# Patient Record
Sex: Female | Born: 1935 | Race: White | Hispanic: No | Smoking: Never smoker
Health system: Southern US, Community
[De-identification: ages and names within clinical notes are randomized; demographics above are authoritative.]

## PROBLEM LIST (undated history)

## (undated) ENCOUNTER — Ambulatory Visit: Admission: EM | Payer: Self-pay | Source: Home / Self Care

## (undated) DIAGNOSIS — K449 Diaphragmatic hernia without obstruction or gangrene: Secondary | ICD-10-CM

## (undated) DIAGNOSIS — K5732 Diverticulitis of large intestine without perforation or abscess without bleeding: Secondary | ICD-10-CM

## (undated) DIAGNOSIS — IMO0001 Reserved for inherently not codable concepts without codable children: Secondary | ICD-10-CM

## (undated) DIAGNOSIS — I10 Essential (primary) hypertension: Secondary | ICD-10-CM

## (undated) DIAGNOSIS — K5900 Constipation, unspecified: Secondary | ICD-10-CM

## (undated) DIAGNOSIS — K219 Gastro-esophageal reflux disease without esophagitis: Secondary | ICD-10-CM

## (undated) DIAGNOSIS — N189 Chronic kidney disease, unspecified: Secondary | ICD-10-CM

## (undated) DIAGNOSIS — K2270 Barrett's esophagus without dysplasia: Secondary | ICD-10-CM

## (undated) DIAGNOSIS — K2950 Unspecified chronic gastritis without bleeding: Secondary | ICD-10-CM

## (undated) HISTORY — PX: RECTOCELE REPAIR: SHX761

## (undated) HISTORY — PX: HERNIA REPAIR: SHX51

## (undated) HISTORY — PX: ESOPHAGOGASTRODUODENOSCOPY: SHX1529

## (undated) HISTORY — PX: ABDOMINAL HYSTERECTOMY: SHX81

## (undated) HISTORY — DX: Chronic kidney disease, unspecified: N18.9

## (undated) HISTORY — DX: Constipation, unspecified: K59.00

## (undated) HISTORY — DX: Essential (primary) hypertension: I10

## (undated) HISTORY — PX: OTHER SURGICAL HISTORY: SHX169

## (undated) HISTORY — DX: Diverticulitis of large intestine without perforation or abscess without bleeding: K57.32

## (undated) HISTORY — DX: Reserved for inherently not codable concepts without codable children: IMO0001

## (undated) HISTORY — PX: COLONOSCOPY: SHX174

## (undated) HISTORY — DX: Barrett's esophagus without dysplasia: K22.70

## (undated) HISTORY — DX: Diaphragmatic hernia without obstruction or gangrene: K44.9

## (undated) HISTORY — PX: BLADDER SUSPENSION: SHX72

## (undated) HISTORY — DX: Unspecified chronic gastritis without bleeding: K29.50

## (undated) HISTORY — DX: Gastro-esophageal reflux disease without esophagitis: K21.9

---

## 2000-01-16 ENCOUNTER — Ambulatory Visit (HOSPITAL_COMMUNITY): Admission: RE | Admit: 2000-01-16 | Discharge: 2000-01-16 | Payer: Self-pay | Admitting: *Deleted

## 2000-01-16 ENCOUNTER — Encounter (INDEPENDENT_AMBULATORY_CARE_PROVIDER_SITE_OTHER): Payer: Self-pay

## 2000-01-16 ENCOUNTER — Encounter: Payer: Self-pay | Admitting: *Deleted

## 2000-11-22 ENCOUNTER — Ambulatory Visit (HOSPITAL_COMMUNITY): Admission: RE | Admit: 2000-11-22 | Discharge: 2000-11-22 | Payer: Self-pay | Admitting: *Deleted

## 2005-07-04 ENCOUNTER — Ambulatory Visit (HOSPITAL_COMMUNITY): Admission: RE | Admit: 2005-07-04 | Discharge: 2005-07-04 | Payer: Self-pay | Admitting: *Deleted

## 2005-07-11 ENCOUNTER — Ambulatory Visit (HOSPITAL_COMMUNITY): Admission: RE | Admit: 2005-07-11 | Discharge: 2005-07-11 | Payer: Self-pay | Admitting: *Deleted

## 2006-06-28 ENCOUNTER — Observation Stay (HOSPITAL_COMMUNITY): Admission: RE | Admit: 2006-06-28 | Discharge: 2006-06-29 | Payer: Self-pay | Admitting: Urology

## 2006-11-12 ENCOUNTER — Ambulatory Visit (HOSPITAL_COMMUNITY): Admission: RE | Admit: 2006-11-12 | Discharge: 2006-11-12 | Payer: Self-pay | Admitting: *Deleted

## 2006-11-12 ENCOUNTER — Encounter (INDEPENDENT_AMBULATORY_CARE_PROVIDER_SITE_OTHER): Payer: Self-pay | Admitting: Specialist

## 2008-04-02 ENCOUNTER — Encounter: Admission: RE | Admit: 2008-04-02 | Discharge: 2008-04-02 | Payer: Self-pay | Admitting: Internal Medicine

## 2008-04-09 ENCOUNTER — Encounter: Admission: RE | Admit: 2008-04-09 | Discharge: 2008-04-09 | Payer: Self-pay | Admitting: Internal Medicine

## 2009-06-24 ENCOUNTER — Encounter: Admission: RE | Admit: 2009-06-24 | Discharge: 2009-06-24 | Payer: Self-pay | Admitting: Internal Medicine

## 2011-01-19 NOTE — Op Note (Signed)
NAMEBLESS, HANSELMAN                 ACCOUNT NO.:  [id number]   MEDICAL RECORD NO.:  [id number]          PATIENT TYPE:  INP   LOCATION:  0002                         FACILITY:  [facility]   PHYSICIAN:  Reece Packer, MD DATE OF BIRTH:  [birth year]   DATE OF PROCEDURE:  06/28/2006  DATE OF DISCHARGE:                                 OPERATIVE REPORT   PREOPERATIVE DIAGNOSES:  1. Vault prolapse.  2. Small cystocele.  3. Possible enterocele, rectocele.   POSTOPERATIVE DIAGNOSES:  1. Vaginal vault prolapse.  2. Small cystocele.  3. Vault prolapse repair plus rectocele repair utilizing dermal graft plus      perineal repair plus cystoscopy.   Ms. Tonn has symptomatic prolapse.  She has a high grade II cystocele that  I thought would deserve an anterior repair, but her primary problem was  vault prolapse with a rectocele and possible enterocele.   The patient was prepped and draped in the usual fashion.  She was given  preoperative antibiotics.  Her blood work was normal.  We took extra care in  leg positioning to minimize the risk of compartment syndrome, DVT, and  neuropathy.  She had a lot of posterior bulging.  The vaginal cuff dimples  are easily identified.  She had a fairly short anterior vaginal wall.  There  was no question with the posterior vaginal wall reduced, she had a minimal  grade 2 high cystocele and did not warrant cystocele repair based upon the  degree of bulging and the shortness of the anterior vaginal wall.   Between Allis clamps, I removed a triangle of perineal skin and then made a  long perineal midline incision all the way to approximately 1 cm from the  apex.  I had used approximately 30 mL of an epinephrine lidocaine mixture  for hemostasis to help develop a plane.  It was quite easy to dissect  sharply and bluntly her very thin vaginal wall mucosa from the very thin or  absent rectovaginal fascia.   I dissected back to the pelvic sidewall bilaterally  and even at the apex.  I  sharply and then bluntly dissected to the pararectal pillars in the upper  third of the vagina.  Hemostasis was excellent.   I then did a digital rectal examination, and I thought she had an  enterocele.  Dr. Ky Barban and I did a lot of sharp dissecting with appropriate  retractors, etc., and all she had was a lot of preperitoneal fat but no  enterocele.  She had a very large rectal fascial rectovaginal fascial defect  with a lot of thinning.  I pushed the yellow perirectal fat up towards the  apex and did, using 3-0 Vicryl, oversewed some thin rectovaginal fascia over  it reducing it.   At this point in time, I then did a two-layer imbricating midline closure,  reducing the posterior defect, and it made the posterior vaginal wall nice  and flat.  I took extra care not to strangulate any bowel.  I did additional  rectal examination, and I could not feel any suture  in the bowel.  I was  very happy with this part of the rectocele repair.   I then carefully placed with the Capio device, 1-0 Ethibond suture at the  ischial spine bilaterally.  There was no bleeding.  I then sewed a 10 x 6 cm  graft and trimmed it more in the shape of a hexagon, and it was sewn back to  the ischial spine bilaterally into the rectovaginal fascia just inside the  introitus.  It fit very well and gave excellent support.  I did not sew it  to the apex, as this would have dramatically shortened her vaginal length.   I trimmed approximately 12 mm of vaginal wall mucosa bilaterally.  I closed  the vaginal wall after irrigation with running 2-0 Vicryl on a CT-1 and used  the same needle to close the perineum from front to back.  She had excellent  vaginal length.   The patient was cystoscoped, and extra indigo carmine was given since she  did not concentrate blue dye that well.  There was no question that there  was efflux of indigo carmine from both ureteral orifices.  There was no   distortion of the bladder.  There was no distortion of the trigone or  ureteral orifices.   Foley catheter was inserted.  A vaginal pack was inserted.  Vaginal length  was excellent.  There is no vaginal narrowing.  I was very pleased with the  repair.  Total blood loss was less than 100 mL.  Leg position was good at  the end of the case.  The patient will be taken to recovery room and be  followed as per protocol.           ______________________________  Reece Packer, MD  Electronically Signed     SAM/MEDQ  D:  06/28/2006  T:  06/29/2006  Job:  [id number]

## 2011-01-19 NOTE — Op Note (Signed)
NAMEWYNNE, CRUVER NO.:  [id number]   MEDICAL RECORD NO.:  [id number]          PATIENT TYPE:  AMB   LOCATION:  ENDO                         FACILITY:  [facility]   PHYSICIAN:  Waverly Ferrari, M.D.    DATE OF BIRTH:  10/15/1929   DATE OF PROCEDURE:  07/11/2005  DATE OF DISCHARGE:                                 OPERATIVE REPORT   PROCEDURE:  Colonoscopy   ENDOSCOPIST:  Waverly Ferrari, M.D.   INDICATIONS:  Rectal bleeding.   ANESTHESIA:  Demerol 70, Versed 8 mg.   DESCRIPTION OF PROCEDURE:  With the patient mildly sedated in the left  lateral decubitus position and rolled to her back, the Olympus videoscopic  colonoscope was inserted in the rectum, passed under direct vision to the  cecum, identified by base of cecum and ileocecal valve.  The ileocecal valve  was somewhat redundant but otherwise unremarkable.  From this point, the  colonoscope was slowly withdrawn taking circumferential views of colonic  mucosa stopping in the rectum which appeared normal on direct and showed  hemorrhoids on retroflexed view.  The endoscope was straightened and  withdrawn. The patient's vital signs, pulse oximeter remained stable. The  patient tolerated the procedure well without complications.   FINDINGS:  1.  Internal hemorrhoids.  2.  Diverticulosis of the sigmoid colon and the ileocecal valve.  3.  Otherwise unremarkable examination.   PLAN:  Have the patient follow up with me as needed.           ______________________________  Waverly Ferrari, M.D.     GMO/MEDQ  D:  07/11/2005  T:  07/11/2005  Job:  [id number]

## 2011-01-19 NOTE — Op Note (Signed)
Brandy Fuentes, DOAKES NO.:  [id number]   MEDICAL RECORD NO.:  [id number]          PATIENT TYPE:  AMB   LOCATION:  ENDO                         FACILITY:  [facility]   PHYSICIAN:  Waverly Ferrari, M.D.    DATE OF BIRTH:  10/15/1929   DATE OF PROCEDURE:  07/04/2005  DATE OF DISCHARGE:                                 OPERATIVE REPORT   PROCEDURE:  Upper endoscopy with dilation.   INDICATIONS:  Dysphagia.   ANESTHESIA:  Demerol 70, Versed 7 mg.   PROCEDURE:  With the patient mildly sedated in the left lateral decubitus  position in room 2 of radiology at [place]  videoscopic endoscope was inserted in the mouth, passed under direct vision  through the esophagus, which appeared normal except for a possible  stricture, into the stomach. The fundus, body, antrum, duodenal bulb, and  second portion of duodenum were visualized. From this point, the endoscope  was slowly withdrawn taking circumferential views of the duodenal mucosa  until the endoscope had been pulled back into the stomach, placed in  retroflexion to view the stomach from below. The endoscope was straightened  and withdrawn after passing a guidewire. Subsequently 15, 17, and 18 Savary  dilators were passed rather easily under fluoroscopic guidance. With the  latter, the guidewire was removed, the  endoscope was reinserted into the  stomach and withdrawn taking circumferential views of the remaining gastric  and esophageal mucosa. The patient's vital signs and pulse oximeter remained  stable. The patient tolerated the procedure well without apparent  complications.   FINDINGS:  Distal esophageal stricture dilated to 15, 17, and 18 Savary.   PLAN:  Await clinical response. The patient will call me with results and  follow-up with me as an outpatient.           ______________________________  Waverly Ferrari, M.D.     GMO/MEDQ  D:  07/04/2005  T:  07/04/2005  Job:  [id number]

## 2011-01-19 NOTE — Procedures (Signed)
[facility]  Patient:    Brandy Fuentes, Brandy Fuentes                          MRN: [id number] Proc. Date: 11/22/00 Adm. Date:  [id number] Disc. Date: [id number] Attending:  Jim Desanctis                           Procedure Report  PROCEDURE:  Colonoscopy.  SURGEON:  Jim Desanctis, M.D.  INDICATIONS:  Rectal bleeding.  ANESTHESIA:  Demerol 70 and Versed 7 mg.  DESCRIPTION OF PROCEDURE:  With the patient mildly sedated in the left lateral decubitus position, the Olympus videoscopic colonoscope was inserted in the rectum and passed under direct vision to the cecum, identified by the ileocecal valve and appendiceal orifice, both of which were photographed. From this point, the colonoscope was slowly withdrawn, taking circumferential views of the entire colonic mucosa, stopping in the rectum which appeared normal on direct view and showed internal hemorrhoids on retroflexed view. The endoscope was straightened and withdrawn.  The patients vital signs and pulse oximeter remained stable.  The patient tolerated the procedure well and without apparent complications.  FINDINGS:  Hemorrhoids, otherwise unremarkable examination other than occasional diverticulum seen in the sigmoid.  PLAN:  Treat as needed for the hemorrhoids. DD:  11/22/00 TD:  11/24/00 Job: [id number]

## 2011-01-19 NOTE — Op Note (Signed)
NAMEIWALANI, SODA NO.:  [id number]   MEDICAL RECORD NO.:  [id number]          PATIENT TYPE:  AMB   LOCATION:  ENDO                         FACILITY:  [place]   PHYSICIAN:  Waverly Ferrari, M.D.    DATE OF BIRTH:  Feb 12, 1929   DATE OF PROCEDURE:  11/12/2006  DATE OF DISCHARGE:                               OPERATIVE REPORT   PROCEDURE:  Upper endoscopy.   INDICATIONS:  Hemoccult positivity.   ANESTHESIA:  Fentanyl 50 mcg, Versed 5 mg.   PROCEDURE:  With the patient mildly sedated in the left lateral  decubitus position, the Pentax videoscopic endoscope was inserted in the  mouth, passed under direct vision through the esophagus which appeared  normal until it reached the stomach which showed a hiatal hernia.  Fundus, body, antrum, duodenal bulb and second portion of the duodenum  were visualized.  From this point, the endoscope was slowly withdrawn  taking circumferential views of the duodenal mucosa until the endoscope  had been pulled into the stomach, placed in retroflexion to view the  stomach from below and in retroflexed view there were some velvety  changes of the cardia of the stomach which were photographed and  biopsied.  The endoscope was then straightened and withdrawn taking  circumferential views of the remaining gastric and esophageal mucosa.  The patient's vital signs and pulse oximetry remained stable.  The  patient tolerated the procedure well without apparent complications.   FINDINGS:  Velvety changes of the cardia of the stomach, biopsied.  Await biopsy report.  The patient will call me for results and follow up  of his outpatient procedure.           ______________________________  Waverly Ferrari, M.D.     GMO/MEDQ  D:  11/12/2006  T:  11/12/2006  Job:  [id number]

## 2011-01-19 NOTE — Op Note (Signed)
NAMENORENE, MINCER NO.:  [id number]   MEDICAL RECORD NO.:  [id number]          PATIENT TYPE:  AMB   LOCATION:  ENDO                         FACILITY:  [facility]   PHYSICIAN:  Waverly Ferrari, M.D.    DATE OF BIRTH:  03/11/1929   DATE OF PROCEDURE:  11/12/2006  DATE OF DISCHARGE:                               OPERATIVE REPORT   PROCEDURE:  Colonoscopy.   INDICATIONS:  Rectal bleeding.   ANESTHESIA:  Fentanyl 50 mcg, Versed 5 mg.   PROCEDURE:  With the patient mildly sedated in the left lateral  decubitus position, the Pentax videoscopic colonoscope was inserted into  the rectum and passed under direct vision to the cecum, identified by  ileocecal valve and appendiceal orifice, both of which were  photographed.  The ileocecal valve was somewhat reddened and bulbous and  this was photographed and biopsied.  From this point, the colonoscope  was then slowly withdrawn, taking circumferential views of the colonic  mucosa, stopping next in the descending colon where a small polyp was  seen, photographed, and removed using hot biopsy forceps technique,  setting at 20/200 blended current.  We next stopped in the sigmoid  colon, which was thickened and reddened, possibly diverticulitis  changes.  This was photographed and biopsied as well.  We next stopped  in the rectum, which appeared normal under direct view and on  retroflexed view showed a raised area just above the anus, which was  photographed and biopsied.  The endoscope was then straightened and  withdrawn.  The patient's vital signs and pulse oximeter remained  stable.  The patient tolerated the procedure well without apparent  complications.   FINDINGS:  Erythematous bulbous ileocecal valve.  Small polyp of  descending colon.  Thickened reddened sigmoid colon, biopsied.  Raised  area of rectum, photographed and biopsied.  Await biopsy reports.  The  patient will call me for results and follow up with me as an  outpatient.           ______________________________  Waverly Ferrari, M.D.     GMO/MEDQ  D:  11/12/2006  T:  11/12/2006  Job:  [id number]

## 2011-01-19 NOTE — Procedures (Signed)
[facility]  Patient:    Brandy Fuentes, Brandy Fuentes                          MRN: [id number] Adm. Date:  [id number] Attending:  Jim Desanctis                           Procedure Report  PROCEDURE:  Upper endoscopy with Savary dilation and biopsy.  ENDOSCOPIST:  Jim Desanctis, M.D.  INDICATION:  Dysphagia.  ANESTHESIA:  Demerol 60 mg and Versed 7 mg were given intravenously in divided doses.  DESCRIPTION OF PROCEDURE:  With patient mildly sedated in the left lateral decubitus position, the Olympus videoscopic endoscope was inserted in the mouth and passed under direct vision through the esophagus, which appeared normal, until we reached the distal esophagus where a stricture was seen above a hiatal hernia.  This was photographed.  Subsequently, we entered into the stomach.  The fundus, body and antrum appeared normal and were photographed. We entered into the duodenal bulb and second portion of duodenum, both of which appeared normal as well, and photographed from this point.  The endoscope was then slowly withdrawn, taking circumferential views of the entire duodenal mucosa until the endoscope had been pulled back in the stomach and placed in retroflexion to view the stomach from below, and this appeared normal as well from this view.  The endoscope was then straightened and pulled back from distal-to-proximal stomach, taking circumferential views of the entire gastric mucosa.  The endoscope was then reinserted to the antrum and the guidewire was passed.  Subsequently, dilators, 14, 17 and 18 Savary dilators, were passed without any resistance.  There was heme seen on the last one, with which the guidewire was withdrawn.  The endoscope was reinserted. Minimal bleeding was seen, none actively, in the esophagus.  The distal esophagus was approached and it was biopsied.  The endoscope was then withdrawn, taken circumferential views of the remaining esophageal mucosa which  otherwise appeared normal.  Patients vital signs and pulse oximetry remained stable.  Patient tolerated procedure well without apparent complications.  FINDINGS:  Esophageal stricture, dilated; await biopsy reports.  PLAN:  Clear liquids today; resume regular diet tomorrow.  Patient to follow up with me as an outpatient.DD:  01/16/00 TD:  01/17/00 Job: [id number]

## 2011-03-22 ENCOUNTER — Other Ambulatory Visit: Payer: Self-pay | Admitting: Internal Medicine

## 2011-03-22 DIAGNOSIS — R7989 Other specified abnormal findings of blood chemistry: Secondary | ICD-10-CM

## 2011-03-22 DIAGNOSIS — IMO0002 Reserved for concepts with insufficient information to code with codable children: Secondary | ICD-10-CM

## 2011-04-23 ENCOUNTER — Ambulatory Visit
Admission: RE | Admit: 2011-04-23 | Discharge: 2011-04-23 | Disposition: A | Discharge status: Home / Self Care | Payer: Medicare Other | Source: Ambulatory Visit | Attending: Internal Medicine | Admitting: Internal Medicine

## 2011-04-23 DIAGNOSIS — R7989 Other specified abnormal findings of blood chemistry: Secondary | ICD-10-CM

## 2011-04-23 DIAGNOSIS — IMO0002 Reserved for concepts with insufficient information to code with codable children: Secondary | ICD-10-CM

## 2012-05-26 ENCOUNTER — Encounter: Payer: Self-pay | Admitting: Internal Medicine

## 2012-06-25 ENCOUNTER — Ambulatory Visit (INDEPENDENT_AMBULATORY_CARE_PROVIDER_SITE_OTHER): Payer: Medicare Other | Admitting: Internal Medicine

## 2012-06-25 ENCOUNTER — Encounter: Payer: Self-pay | Admitting: Internal Medicine

## 2012-06-25 VITALS — BP 146/72 | HR 84 | Ht 60.0 in | Wt 168.4 lb

## 2012-06-25 DIAGNOSIS — K6289 Other specified diseases of anus and rectum: Secondary | ICD-10-CM

## 2012-06-25 DIAGNOSIS — K649 Unspecified hemorrhoids: Secondary | ICD-10-CM

## 2012-06-25 DIAGNOSIS — R159 Full incontinence of feces: Secondary | ICD-10-CM

## 2012-06-25 DIAGNOSIS — N816 Rectocele: Secondary | ICD-10-CM

## 2012-06-25 MED ORDER — PSYLLIUM 28 % PO PACK: 1.0000 | PACK | Freq: Two times a day (BID) | ORAL | Status: DC

## 2012-06-25 NOTE — Progress Notes (Signed)
Subjective:    Patient ID: Brandy Fuentes, female    DOB: 1929-03-31, 78 y.o.   MRN: [id number]  HPI This is a delightful elderly white woman, recently widowed this year, with a history of previous colonoscopy through Dr. Lajoyce Corners. She has had 2 colonoscopies, no polyps in 2002 but she had several polyps that were really hyperplastic or normal colonic mucosal tissue in 2006. She had a history of rectal bleeding intermittently with some hemorrhoid changes and she also had some mildly abnormal mucosa was some nonspecific inflammatory changes in the sigmoid and rectum. She is okay now though for the past year she has had intermittent fecal incontinence. She'll have normal bowel movements and then later in the day expel some looser stool. She also has chronic urinary incontinence and has had a rectocele and vaginal vault repair in 2007. She also has a history of a prolapsed bladder and thinks it that has recurred after a remote history of bladder suspension years ago. She is not interested in therapy for her urinary incontinence, she wears a pad and says that's fine. She denies any lower extremity weakness or sensory complaints. Otherwise, she is active and still takes care of her house, rake sleeves and drives a car. Her GI review of systems is otherwise negative No Known Allergies Current outpatient prescriptions:amLODipine (NORVASC) 5 MG tablet, Take 5 mg by mouth daily., Disp: , Rfl: ;  losartan (COZAAR) 100 MG tablet, Take 100 mg by mouth daily., Disp: , Rfl: ;  omeprazole (PRILOSEC) 20 MG capsule, Take 20 mg by mouth daily., Disp: , Rfl:   Past Medical History  Diagnosis Date  . Diverticulitis of sigmoid colon   . Chronic gastritis   . Barrett's esophagus   . Hypertension    Past Surgical History  Procedure Date  . Colonoscopy 11/22/00,07/11/05    Dr. Jim Desanctis  . Esophagogastroduodenoscopy 01/16/00,07/04/05    Dr. Jim Desanctis, with Dilation  . Abdominal hysterectomy   . Hernia repair   .  Rectocele repair   . Vault prolapse repair   . Bladder suspension    History   Social History  . Marital Status: Married    Spouse Name: N/A    Number of Children: 2  . Years of Education: N/A   Occupational History  . Retired    Social History Main Topics  . Smoking status: Never Smoker   . Smokeless tobacco: Never Used  . Alcohol Use: No  . Drug Use: No    Family History  Problem Relation Age of Onset  . Heart disease Mother   . Heart disease Father   . Colon cancer Brother   . Colon polyps Brother   . Heart disease Brother   . Heart disease Maternal Uncle   . Breast cancer Paternal Aunt   . Esophageal cancer Paternal Aunt   . Heart disease Paternal Uncle      Review of Systems This is positive for some arthritic back pain and joint pains, some fatigue, she has chronic urinary incontinence stress related, she complains of intermittent pedal edema. All other review of systems are negative at this time.    Objective:   Physical Exam General:  Well-developed, well-nourished and in no acute distress Eyes:  anicteric. ENT:   Mouth and posterior pharynx free of lesions.  Neck:   supple w/o thyromegaly or mass.  Lungs: Clear to auscultation bilaterally. Heart:  S1S2, no rubs, murmurs, gallops. Abdomen:  soft, non-tender, no hepatosplenomegaly, hernia, or mass and  BS+.  Rectal: Female staff present - + anal tags and hemorrhoids, no rash. Anal wink is absent. There is reduced resting sphincter tone. + rectocele mod-large, no mass, soft brown stool. Voluntary anal squeeze is reduced. Simulated defecation with appropriate abdominal contraction and descent. Lymph:  no cervical or supraclavicular adenopathy. Extremities:   no edema Skin   no rash. Neuro:  A&O x 3.  Psych:  appropriate mood and  Affect.   Data Reviewed: 2002 and 2006 colonoscopy reports and pathology reports. 2006 EGD report and pathology reports       Assessment & Plan:   1. Fecal incontinence     2. Anal sphincter incompetence   3. Rectocele   4. Unspecified hemorrhoids without mention of complication    1. I have recommended fiber supplementation to try to help his intermittent incontinence. She obviously has a weak anal sphincter, she did say she had tears and problems with she delivered her 2 children so it could be somewhat related to that and she probably has some other pelvic floor issues. 2. She is not interested in pursuing a colonoscopy and given that she has had 2 without precancerous polyps, and being 83 I do not recommend one at this time. I think we have an adequate explanation for her fecal incontinence and would not pursue further investigation. She understands and agrees with the plans to followup as needed.  I appreciate the opportunity to care for this patient.   CC: Helane Rima, MD

## 2012-06-25 NOTE — Patient Instructions (Addendum)
Per Dr. Carlean Purl try Metamucil which is over the counter.  Start with 1 teaspoon and work up to 1 tablespoon a day.  This may help with your bowel situation.  Return to see [place] as needed.  Thank you for choosing me and [facility].  Gatha Mayer, M.D., [facility]

## 2012-09-05 ENCOUNTER — Encounter: Payer: Self-pay | Admitting: Cardiovascular Disease

## 2012-09-05 DIAGNOSIS — K2270 Barrett's esophagus without dysplasia: Secondary | ICD-10-CM | POA: Insufficient documentation

## 2012-09-05 DIAGNOSIS — K5732 Diverticulitis of large intestine without perforation or abscess without bleeding: Secondary | ICD-10-CM | POA: Insufficient documentation

## 2012-09-05 DIAGNOSIS — I10 Essential (primary) hypertension: Secondary | ICD-10-CM | POA: Insufficient documentation

## 2012-09-08 ENCOUNTER — Encounter: Payer: Self-pay | Admitting: Cardiovascular Disease

## 2012-09-08 ENCOUNTER — Ambulatory Visit (INDEPENDENT_AMBULATORY_CARE_PROVIDER_SITE_OTHER): Payer: Medicare Other | Admitting: Cardiovascular Disease

## 2012-09-08 VITALS — BP 155/86 | HR 96 | Ht 60.0 in | Wt 174.8 lb

## 2012-09-08 DIAGNOSIS — R0609 Other forms of dyspnea: Secondary | ICD-10-CM

## 2012-09-08 DIAGNOSIS — R0989 Other specified symptoms and signs involving the circulatory and respiratory systems: Secondary | ICD-10-CM

## 2012-09-08 DIAGNOSIS — I10 Essential (primary) hypertension: Secondary | ICD-10-CM

## 2012-09-08 DIAGNOSIS — R0602 Shortness of breath: Secondary | ICD-10-CM

## 2012-09-08 DIAGNOSIS — R0600 Dyspnea, unspecified: Secondary | ICD-10-CM | POA: Insufficient documentation

## 2012-09-08 MED ORDER — FUROSEMIDE 20 MG PO TABS: 20.0000 mg | ORAL_TABLET | Freq: Every day | ORAL | Status: DC

## 2012-09-08 NOTE — Patient Instructions (Addendum)
**Note De-Identified Brandy Fuentes Obfuscation** Your physician has requested that you have an echocardiogram. Echocardiography is a painless test that uses sound waves to create images of your heart. It provides your doctor with information about the size and shape of your heart and how well your heart's chambers and valves are working. This procedure takes approximately one hour. There are no restrictions for this procedure.  Your physician has recommended you make the following change in your medication: stop taking Norvasc and start taking Lasix (Furosemide) 20 mg daily  Your physician recommends that you return for lab work in: 5 to 6 weeks   Your physician recommends that you schedule a follow-up appointment in: 5 to 6 weeks

## 2012-09-08 NOTE — Assessment & Plan Note (Signed)
D/C norvasc given edema.  Continue cozaar start lasix

## 2012-09-08 NOTE — Assessment & Plan Note (Signed)
Echo to assess RV and LV function Should be normal given normal CXR and ECG and exam.  Lasix and f/u labs in 5 weeks

## 2012-09-08 NOTE — Progress Notes (Signed)
Patient ID: Brandy Fuentes, female   DOB: Jul 07, 1929, 79 y.o.   MRN: [id number] 79 yo referred by urgent care for edema, HTN and dyspnea.  Nonsmoker with no history of heart or lung disease.  Has had swelling for over a year.  Recently had norvasc increased. No history of DVT.  Edema is worse as the day goes on and better in am.  No history of CHF.  CXR done at urgent care 12/23 reviewed and normal.  She is not on a diuretic.  Active for age but more extertional dyspnea.  No chest pain.  CXR showed no cardiomegaly.  Denies asthma or wheezing.  Compliant with meds.  Moderate salt intake.   Labs 12/23  WBC 7.5 Hct 37.7   ROS: Denies fever, malais, weight loss, blurry vision, decreased visual acuity, cough, sputum, SOB, hemoptysis, pleuritic pain, palpitaitons, heartburn, abdominal pain, melena, lower extremity edema, claudication, or rash.  All other systems reviewed and negative   General: Affect appropriate Healthy:  appears stated age 84: normal Neck supple with no adenopathy JVP normal no bruits no thyromegaly Lungs clear with no wheezing and good diaphragmatic motion Heart:  S1/S2 no murmur,rub, gallop or click PMI normal Abdomen: benighn, BS positve, no tenderness, no AAA no bruit.  No HSM or HJR Distal pulses intact with no bruits No edema Neuro non-focal Skin warm and dry No muscular weakness  Medications Current Outpatient Prescriptions  Medication Sig Dispense Refill  . losartan (COZAAR) 100 MG tablet Take 100 mg by mouth daily.      Marland Kitchen omeprazole (PRILOSEC) 20 MG capsule Take 20 mg by mouth daily.        Allergies Review of patient's allergies indicates no known allergies.  Family History: Family History  Problem Relation Age of Onset  . Heart disease Mother   . Heart disease Father   . Colon cancer Brother   . Colon polyps Brother   . Heart disease Brother   . Heart disease Maternal Uncle   . Breast cancer Paternal Aunt   . Esophageal cancer Paternal Aunt   .  Heart disease Paternal Uncle     Social History: History   Social History  . Marital Status: Married    Spouse Name: N/A    Number of Children: 2  . Years of Education: N/A   Occupational History  . Retired    Social History Main Topics  . Smoking status: Never Smoker   . Smokeless tobacco: Never Used  . Alcohol Use: No  . Drug Use: No  . Sexually Active: Not on file   Other Topics Concern  . Not on file   Social History Narrative  . No narrative on file    Electrocardiogram:  NSR rate 87 normal ECG  Assessment and Plan

## 2012-09-15 ENCOUNTER — Ambulatory Visit (HOSPITAL_COMMUNITY): Payer: Medicare Other | Attending: Cardiovascular Disease

## 2012-09-15 DIAGNOSIS — R0989 Other specified symptoms and signs involving the circulatory and respiratory systems: Secondary | ICD-10-CM | POA: Insufficient documentation

## 2012-09-15 DIAGNOSIS — I379 Nonrheumatic pulmonary valve disorder, unspecified: Secondary | ICD-10-CM | POA: Insufficient documentation

## 2012-09-15 DIAGNOSIS — R0602 Shortness of breath: Secondary | ICD-10-CM

## 2012-09-15 DIAGNOSIS — I369 Nonrheumatic tricuspid valve disorder, unspecified: Secondary | ICD-10-CM | POA: Insufficient documentation

## 2012-09-15 DIAGNOSIS — R0609 Other forms of dyspnea: Secondary | ICD-10-CM | POA: Insufficient documentation

## 2012-09-15 DIAGNOSIS — I10 Essential (primary) hypertension: Secondary | ICD-10-CM | POA: Insufficient documentation

## 2012-09-15 DIAGNOSIS — I059 Rheumatic mitral valve disease, unspecified: Secondary | ICD-10-CM | POA: Insufficient documentation

## 2012-09-15 NOTE — Progress Notes (Signed)
Echocardiogram performed.  

## 2012-10-03 ENCOUNTER — Other Ambulatory Visit: Payer: Self-pay | Admitting: *Deleted

## 2012-10-03 DIAGNOSIS — I10 Essential (primary) hypertension: Secondary | ICD-10-CM

## 2012-10-03 DIAGNOSIS — R609 Edema, unspecified: Secondary | ICD-10-CM

## 2012-10-06 ENCOUNTER — Other Ambulatory Visit (INDEPENDENT_AMBULATORY_CARE_PROVIDER_SITE_OTHER): Payer: Medicare Other

## 2012-10-06 ENCOUNTER — Ambulatory Visit (INDEPENDENT_AMBULATORY_CARE_PROVIDER_SITE_OTHER): Payer: Medicare Other | Admitting: Cardiovascular Disease

## 2012-10-06 ENCOUNTER — Encounter: Payer: Self-pay | Admitting: Cardiovascular Disease

## 2012-10-06 VITALS — BP 132/72 | HR 70 | Ht 60.0 in | Wt 166.6 lb

## 2012-10-06 DIAGNOSIS — I10 Essential (primary) hypertension: Secondary | ICD-10-CM

## 2012-10-06 DIAGNOSIS — R609 Edema, unspecified: Secondary | ICD-10-CM

## 2012-10-06 LAB — BASIC METABOLIC PANEL WITH GFR
BUN: 43 mg/dL — ABNORMAL HIGH (ref 6–23)
CO2: 23 meq/L (ref 19–32)
Calcium: 8.8 mg/dL (ref 8.4–10.5)
Chloride: 105 meq/L (ref 96–112)
Creatinine, Ser: 2.7 mg/dL — ABNORMAL HIGH (ref 0.4–1.2)
GFR: 18.32 mL/min — ABNORMAL LOW (ref >60.00)
Glucose, Bld: 94 mg/dL (ref 70–99)
Potassium: 4 meq/L (ref 3.5–5.1)
Sodium: 138 meq/L (ref 135–145)

## 2012-10-06 NOTE — Progress Notes (Signed)
Patient ID: Brandy Fuentes, female   DOB: 12-13-1928, 79 y.o.   MRN: [id number] 79 yo referred by urgent care for edema, HTN and dyspnea. Nonsmoker with no history of heart or lung disease. Has had swelling for over a year. Recently had norvasc increased. No history of DVT. Edema is worse as the day goes on and better in am. No history of CHF. CXR done at urgent care 12/23 reviewed and normal. She is not on a diuretic. Active for age but more extertional dyspnea. No chest pain. CXR showed no cardiomegaly. Denies asthma or wheezing. Compliant with meds. Moderate salt intake.  Labs 12/23 WBC 7.5 Hct 37.7   Started on lasix 3 weeks ago and calcium blocker stopped due to edema  Echo 09/15/12 normal EF 55-60% and mild MR  Improved with med changes. BMET today  ROS: Denies fever, malais, weight loss, blurry vision, decreased visual acuity, cough, sputum, SOB, hemoptysis, pleuritic pain, palpitaitons, heartburn, abdominal pain, melena, lower extremity edema, claudication, or rash.  All other systems reviewed and negative  General: Affect appropriate Healthy:  appears stated age 81: normal Neck supple with no adenopathy JVP normal no bruits no thyromegaly Lungs clear with no wheezing and good diaphragmatic motion Heart:  S1/S2 no murmur, no rub, gallop or click PMI normal Abdomen: benighn, BS positve, no tenderness, no AAA no bruit.  No HSM or HJR Distal pulses intact with no bruits Trace edema with varicose veins  Neuro non-focal Skin warm and dry No muscular weakness   Current Outpatient Prescriptions  Medication Sig Dispense Refill  . furosemide (LASIX) 20 MG tablet Take 1 tablet (20 mg total) by mouth daily.  30 tablet  2  . losartan (COZAAR) 100 MG tablet Take 100 mg by mouth daily.      Marland Kitchen omeprazole (PRILOSEC) 20 MG capsule Take 20 mg by mouth daily.        Allergies  Review of patient's allergies indicates no known allergies.  Electrocardiogram:  09/08/12 SR rate 87  normal    Assessment and Plan

## 2012-10-06 NOTE — Assessment & Plan Note (Signed)
Well controlled.  Continue current medications and low sodium Dash type diet.    

## 2012-10-06 NOTE — Patient Instructions (Signed)
Your physician recommends that you schedule a follow-up appointment in: AS NEEDED  Your physician recommends that you continue on your current medications as directed. Please refer to the Current Medication list given to you today.  

## 2012-10-06 NOTE — Assessment & Plan Note (Signed)
Resolved with med changes more from varicose veins  Check lytes  F/U Dr Lavone Neri

## 2012-10-09 ENCOUNTER — Other Ambulatory Visit: Payer: Self-pay | Admitting: *Deleted

## 2012-10-09 DIAGNOSIS — Z79899 Other long term (current) drug therapy: Secondary | ICD-10-CM

## 2012-10-13 ENCOUNTER — Other Ambulatory Visit: Payer: Medicare Other

## 2012-10-13 ENCOUNTER — Ambulatory Visit: Payer: Medicare Other | Admitting: Cardiovascular Disease

## 2012-10-21 ENCOUNTER — Encounter: Payer: Self-pay | Admitting: Cardiovascular Disease

## 2012-10-30 ENCOUNTER — Other Ambulatory Visit (INDEPENDENT_AMBULATORY_CARE_PROVIDER_SITE_OTHER): Payer: Medicare Other

## 2012-10-30 DIAGNOSIS — Z79899 Other long term (current) drug therapy: Secondary | ICD-10-CM

## 2012-10-30 LAB — BASIC METABOLIC PANEL WITH GFR
BUN: 32 mg/dL — ABNORMAL HIGH (ref 6–23)
CO2: 24 meq/L (ref 19–32)
Calcium: 9.4 mg/dL (ref 8.4–10.5)
Chloride: 104 meq/L (ref 96–112)
Creatinine, Ser: 2.3 mg/dL — ABNORMAL HIGH (ref 0.4–1.2)
GFR: 22.13 mL/min — ABNORMAL LOW (ref >60.00)
Glucose, Bld: 95 mg/dL (ref 70–99)
Potassium: 3.8 meq/L (ref 3.5–5.1)
Sodium: 138 meq/L (ref 135–145)

## 2012-11-03 ENCOUNTER — Other Ambulatory Visit: Payer: Self-pay | Admitting: *Deleted

## 2012-11-03 DIAGNOSIS — N289 Disorder of kidney and ureter, unspecified: Secondary | ICD-10-CM

## 2012-11-03 DIAGNOSIS — O358XX0 Maternal care for other (suspected) fetal abnormality and damage, not applicable or unspecified: Secondary | ICD-10-CM

## 2012-11-03 DIAGNOSIS — O35EXX0 Maternal care for other (suspected) fetal abnormality and damage, fetal genitourinary anomalies, not applicable or unspecified: Secondary | ICD-10-CM

## 2012-11-03 MED ORDER — FUROSEMIDE 20 MG PO TABS: 10.0000 mg | ORAL_TABLET | Freq: Every day | ORAL | Status: DC

## 2012-11-24 ENCOUNTER — Other Ambulatory Visit: Payer: Medicare Other

## 2012-11-26 ENCOUNTER — Other Ambulatory Visit (INDEPENDENT_AMBULATORY_CARE_PROVIDER_SITE_OTHER): Payer: Medicare Other

## 2012-11-26 ENCOUNTER — Encounter (INDEPENDENT_AMBULATORY_CARE_PROVIDER_SITE_OTHER): Payer: Medicare Other

## 2012-11-26 DIAGNOSIS — I10 Essential (primary) hypertension: Secondary | ICD-10-CM

## 2012-11-26 DIAGNOSIS — R609 Edema, unspecified: Secondary | ICD-10-CM

## 2012-11-26 DIAGNOSIS — O358XX0 Maternal care for other (suspected) fetal abnormality and damage, not applicable or unspecified: Secondary | ICD-10-CM

## 2012-11-26 DIAGNOSIS — N189 Chronic kidney disease, unspecified: Secondary | ICD-10-CM

## 2012-11-26 DIAGNOSIS — N289 Disorder of kidney and ureter, unspecified: Secondary | ICD-10-CM

## 2012-11-26 DIAGNOSIS — O35EXX0 Maternal care for other (suspected) fetal abnormality and damage, fetal genitourinary anomalies, not applicable or unspecified: Secondary | ICD-10-CM

## 2012-11-26 LAB — BASIC METABOLIC PANEL WITH GFR
BUN: 29 mg/dL — ABNORMAL HIGH (ref 6–23)
CO2: 26 meq/L (ref 19–32)
Calcium: 9.2 mg/dL (ref 8.4–10.5)
Chloride: 108 meq/L (ref 96–112)
Creatinine, Ser: 2.3 mg/dL — ABNORMAL HIGH (ref 0.4–1.2)
GFR: 21.57 mL/min — ABNORMAL LOW (ref >60.00)
Glucose, Bld: 94 mg/dL (ref 70–99)
Potassium: 3.9 meq/L (ref 3.5–5.1)
Sodium: 141 meq/L (ref 135–145)

## 2012-11-30 DIAGNOSIS — R52 Pain, unspecified: Secondary | ICD-10-CM | POA: Insufficient documentation

## 2012-12-10 ENCOUNTER — Other Ambulatory Visit: Payer: Self-pay | Admitting: *Deleted

## 2012-12-10 DIAGNOSIS — N289 Disorder of kidney and ureter, unspecified: Secondary | ICD-10-CM

## 2012-12-12 DIAGNOSIS — N185 Chronic kidney disease, stage 5: Secondary | ICD-10-CM | POA: Insufficient documentation

## 2012-12-12 DIAGNOSIS — D649 Anemia, unspecified: Secondary | ICD-10-CM | POA: Insufficient documentation

## 2012-12-12 DIAGNOSIS — R600 Localized edema: Secondary | ICD-10-CM | POA: Insufficient documentation

## 2013-02-25 ENCOUNTER — Telehealth: Payer: Self-pay | Admitting: Cardiovascular Disease

## 2013-02-25 NOTE — Telephone Encounter (Signed)
Beatrice faxed to [facility] [phone] 02/25/13/KM

## 2013-04-30 ENCOUNTER — Encounter: Payer: Medicare Other | Admitting: Internal Medicine

## 2013-09-08 DIAGNOSIS — E785 Hyperlipidemia, unspecified: Secondary | ICD-10-CM | POA: Insufficient documentation

## 2014-03-09 DIAGNOSIS — F329 Major depressive disorder, single episode, unspecified: Secondary | ICD-10-CM | POA: Insufficient documentation

## 2014-03-09 DIAGNOSIS — F32A Depression, unspecified: Secondary | ICD-10-CM | POA: Insufficient documentation

## 2015-03-14 DIAGNOSIS — Z5329 Procedure and treatment not carried out because of patient's decision for other reasons: Secondary | ICD-10-CM | POA: Insufficient documentation

## 2015-03-14 DIAGNOSIS — Z5320 Procedure and treatment not carried out because of patient's decision for unspecified reasons: Secondary | ICD-10-CM | POA: Insufficient documentation

## 2015-03-14 DIAGNOSIS — K5732 Diverticulitis of large intestine without perforation or abscess without bleeding: Secondary | ICD-10-CM | POA: Insufficient documentation

## 2016-10-25 ENCOUNTER — Ambulatory Visit (INDEPENDENT_AMBULATORY_CARE_PROVIDER_SITE_OTHER): Payer: Medicare Other | Admitting: Cardiovascular Disease

## 2016-10-25 ENCOUNTER — Encounter: Payer: Self-pay | Admitting: Cardiovascular Disease

## 2016-10-25 VITALS — BP 140/62 | HR 68 | Ht <= 58 in | Wt 151.1 lb

## 2016-10-25 DIAGNOSIS — I10 Essential (primary) hypertension: Secondary | ICD-10-CM

## 2016-10-25 DIAGNOSIS — R011 Cardiac murmur, unspecified: Secondary | ICD-10-CM

## 2016-10-25 DIAGNOSIS — Z7689 Persons encountering health services in other specified circumstances: Secondary | ICD-10-CM

## 2016-10-25 DIAGNOSIS — R0602 Shortness of breath: Secondary | ICD-10-CM

## 2016-10-25 NOTE — Patient Instructions (Addendum)

## 2016-10-25 NOTE — Progress Notes (Signed)
Cardiology Office Note   Date:  10/25/2016   ID:  Brandy Fuentes, DOB 31-Oct-1928, MRN [id number]  PCP:  Helane Rima, MD  Cardiologist:   Jenkins Rouge, MD   Chief Complaint  Patient presents with  . Establish Care      History of Present Illness: Brandy Fuentes is a 83 y.o. female who presents for evaluation of dizziness. Started with flu in January. Occurs With standing 2-3 / week.  No palpitations dyspnea or chest pain. Some stress son has cancer.  CRF;s HTN  And elevated lipids. Meds adjusted d/c losartan and diuretic Dizziness better not postural. Worried about Leaky valve. Has chronic LE edema from varicose veins. No chest pain. Compliant with meds Still driving And doing all ADL's     Past Medical History:  Diagnosis Date  . Barrett's esophagus   . Chronic gastritis   . Constipation   . Diverticulitis of sigmoid colon   . Hiatal hernia   . Hypertension   . Reflux     Past Surgical History:  Procedure Laterality Date  . ABDOMINAL HYSTERECTOMY    . BLADDER SUSPENSION    . COLONOSCOPY  11/22/00,07/11/05   Dr. Jim Desanctis  . ESOPHAGOGASTRODUODENOSCOPY  01/16/00,07/04/05   Dr. Jim Desanctis, with Dilation  . HERNIA REPAIR    . RECTOCELE REPAIR    . vault prolapse repair       Current Outpatient Prescriptions  Medication Sig Dispense Refill  . Cholecalciferol (VITAMIN D-3) 5000 units TABS Take 1 tablet by mouth daily.    . Cod Liver Oil (COD LIVER PO) Take 1 tablet by mouth daily.    Marland Kitchen losartan (COZAAR) 100 MG tablet Take 100 mg by mouth daily.    Marland Kitchen omeprazole (PRILOSEC) 20 MG capsule Take 20 mg by mouth daily.     No current facility-administered medications for this visit.     Allergies:   Ciprofloxacin    Social History:  The patient  reports that she has never smoked. She has never used smokeless tobacco. She reports that she does not drink alcohol or use drugs.   Family History:  The patient's family history includes Breast cancer in her paternal  aunt; Colon cancer in her brother; Colon polyps in her brother; Esophageal cancer in her paternal aunt; Heart disease in her brother, father, maternal uncle, mother, and paternal uncle.    ROS:  Please see the history of present illness.   Otherwise, review of systems are positive for none.   All other systems are reviewed and negative.    PHYSICAL EXAM: VS:  BP 140/62   Pulse 68   Ht 4\' 8"  (1.422 m)   Wt 151 lb 1.9 oz (68.5 kg)   SpO2 95%   BMI 33.88 kg/m  , BMI Body mass index is 33.88 kg/m. Affect appropriate Healthy:  appears stated age 85: normal Neck supple with no adenopathy JVP normal no bruits no thyromegaly Lungs clear with no wheezing and good diaphragmatic motion Heart:  S1/S2 SEM murmur, no rub, gallop or click PMI normal Abdomen: benighn, BS positve, no tenderness, no AAA no bruit.  No HSM or HJR Distal pulses intact with no bruits Plus 2 LE  Edema with mild erythema and psoriatic changes on right  Varicose veins severe  Neuro non-focal Skin warm and dry No muscular weakness    EKG:  2014 SR normal  10/25/16  SR rate 79 LAD otherwise normal    Recent Labs: No results found for  requested labs within last 8760 hours.    Lipid Panel No results found for: CHOL, TRIG, HDL, CHOLHDL, VLDL, LDLCALC, LDLDIRECT    Wt Readings from Last 3 Encounters:  10/25/16 151 lb 1.9 oz (68.5 kg)  10/06/12 166 lb 9.6 oz (75.6 kg)  09/08/12 174 lb 12.8 oz (79.3 kg)      Other studies Reviewed: Additional studies/ records that were reviewed today include: Notes from primary labs ECG.    ASSESSMENT AND PLAN:  1.  Dizziness not related to heart or arrhythmia better with less diuretic 2. Murmur:  Doubt significant regurgitation just mild MR 2014 Given dyspnea Will repeat  3. Edema:  PRN lasix compression hose. Told her to keep right leg covered ? See derm for psoriatic lotion.  4. HTN:  Well controlled.  Continue current medications and low sodium Dash type diet.       Current medicines are reviewed at length with the patient today.  The patient does not have concerns regarding medicines.  The following changes have been made:  no change  Labs/ tests ordered today include: Echo  Orders Placed This Encounter  Procedures  . EKG 12-Lead  . ECHOCARDIOGRAM COMPLETE     Disposition:   FU with [place] PRN      Signed, Jenkins Rouge, MD  10/25/2016 4:42 PM    [facility] [address], [place], [place]  [address] Phone: [phone]; Fax: [phone]

## 2016-11-08 ENCOUNTER — Other Ambulatory Visit (HOSPITAL_COMMUNITY): Payer: Medicare Other

## 2016-11-19 ENCOUNTER — Other Ambulatory Visit: Payer: Self-pay

## 2016-11-19 ENCOUNTER — Ambulatory Visit (HOSPITAL_COMMUNITY): Payer: Medicare Other | Attending: Cardiovascular Disease

## 2016-11-19 DIAGNOSIS — R0602 Shortness of breath: Secondary | ICD-10-CM | POA: Insufficient documentation

## 2016-11-19 DIAGNOSIS — R011 Cardiac murmur, unspecified: Secondary | ICD-10-CM | POA: Insufficient documentation

## 2016-11-19 DIAGNOSIS — I071 Rheumatic tricuspid insufficiency: Secondary | ICD-10-CM | POA: Diagnosis not present

## 2018-06-20 ENCOUNTER — Ambulatory Visit (INDEPENDENT_AMBULATORY_CARE_PROVIDER_SITE_OTHER): Payer: Medicare Other

## 2018-06-20 ENCOUNTER — Other Ambulatory Visit: Payer: Self-pay | Admitting: Podiatry

## 2018-06-20 ENCOUNTER — Ambulatory Visit (INDEPENDENT_AMBULATORY_CARE_PROVIDER_SITE_OTHER): Payer: Medicare Other | Admitting: Podiatry

## 2018-06-20 VITALS — BP 141/68 | HR 65

## 2018-06-20 DIAGNOSIS — M7662 Achilles tendinitis, left leg: Secondary | ICD-10-CM

## 2018-06-20 DIAGNOSIS — M722 Plantar fascial fibromatosis: Secondary | ICD-10-CM

## 2018-06-20 DIAGNOSIS — K635 Polyp of colon: Secondary | ICD-10-CM | POA: Insufficient documentation

## 2018-06-20 DIAGNOSIS — M79672 Pain in left foot: Secondary | ICD-10-CM

## 2018-06-20 DIAGNOSIS — M7732 Calcaneal spur, left foot: Secondary | ICD-10-CM | POA: Diagnosis not present

## 2018-06-20 MED ORDER — DICLOFENAC SODIUM 1 % TD GEL
2.0000 g | Freq: Four times a day (QID) | TRANSDERMAL | 2 refills | Status: DC
Start: 2018-06-20 — End: 2020-11-17

## 2018-06-20 NOTE — Patient Instructions (Signed)

## 2018-06-22 DIAGNOSIS — M7732 Calcaneal spur, left foot: Secondary | ICD-10-CM | POA: Insufficient documentation

## 2018-06-22 NOTE — Progress Notes (Signed)
Subjective:   Patient ID: Brandy Fuentes, female   DOB: 84 y.o.   MRN: [id number]   HPI 84 year old female presents the office today for concerns of pain to the back of her left heel.  She says it feels that there is a pulling sensation when trying to walk.  She has some occasional pain in the bottom of her foot as well the majority pain is the back of the left heel.  She denies any recent injury trauma she said no recent treatment.  She has no other concerns today.   Review of Systems  All other systems reviewed and are negative.  Past Medical History:  Diagnosis Date  . Barrett's esophagus   . Chronic gastritis   . Constipation   . Diverticulitis of sigmoid colon   . Hiatal hernia   . Hypertension   . Reflux     Past Surgical History:  Procedure Laterality Date  . ABDOMINAL HYSTERECTOMY    . BLADDER SUSPENSION    . COLONOSCOPY  11/22/00,07/11/05   Dr. Jim Desanctis  . ESOPHAGOGASTRODUODENOSCOPY  01/16/00,07/04/05   Dr. Jim Desanctis, with Dilation  . HERNIA REPAIR    . RECTOCELE REPAIR    . vault prolapse repair       Current Outpatient Medications:  .  amLODipine (NORVASC) 2.5 MG tablet, TAKE 1 TABLET BY MOUTH EVERY DAY, Disp: , Rfl:  .  furosemide (LASIX) 20 MG tablet, TAKE 1 TABLET(20 MG) BY MOUTH DAILY AS NEEDED, Disp: , Rfl:  .  Cholecalciferol (VITAMIN D-3) 5000 units TABS, Take 1 tablet by mouth daily., Disp: , Rfl:  .  Cod Liver Oil (COD LIVER PO), Take 1 tablet by mouth daily., Disp: , Rfl:  .  diclofenac sodium (VOLTAREN) 1 % GEL, Apply 2 g topically 4 (four) times daily. Rub into affected area of foot 2 to 4 times daily, Disp: 100 g, Rfl: 2 .  ferrous sulfate 325 (65 FE) MG tablet, Take by mouth., Disp: , Rfl:  .  FLUAD 0.5 ML SUSY, ADM 0.5ML IM UTD, Disp: , Rfl: 0 .  losartan (COZAAR) 100 MG tablet, Take 100 mg by mouth daily., Disp: , Rfl:  .  mesalamine (LIALDA) 1.2 g EC tablet, Take 2.4 g by mouth daily., Disp: , Rfl: 12 .  omeprazole (PRILOSEC) 20 MG capsule,  Take 20 mg by mouth daily., Disp: , Rfl:   Allergies  Allergen Reactions  . Ciprofloxacin     Made her feel crazy, per pt        Objective:  Physical Exam  General: AAO x3, NAD  Dermatological: Skin is warm, dry and supple bilateral. Nails x 10 are well manicured; remaining integument appears unremarkable at this time. There are no open sores, no preulcerative lesions, no rash or signs of infection present.  Vascular: Dorsalis Pedis artery and Posterior Tibial artery pedal pulses are 2/4 bilateral with immedate capillary fill time. Pedal hair growth present. No varicosities and no lower extremity edema present bilateral. There is no pain with calf compression, swelling, warmth, erythema.   Neruologic: Grossly intact via light touch bilateral. Vibratory intact via tuning fork bilateral. Protective threshold with Semmes Wienstein monofilament intact to all pedal sites bilateral.   Musculoskeletal: There is tenderness palpation of the posterior aspect left heel in the area of a prominent bone spur.  Mild discomfort on the distal portion of the Achilles tendon along insertion into the calcaneus as well.  Thompson test is negative the Achilles tendon appears to be  intact there is no deficit noted.  Minimal discomfort along the course of the flexor tendons just posterior to the medial malleolus as well however the majority tenderness is along the posterior left penis.  There is no pain with lateral compression of the calcaneus.  No pain on the plantar fascia.  Decreased medial arch upon weightbearing.  Muscular strength 5/5 in all groups tested bilateral.  Equinus present.    Assessment:   Left posterior heel spur, Achilles tendinitis     Plan:  -Treatment options discussed including all alternatives, risks, and complications -Etiology of symptoms were discussed -X-rays were obtained and reviewed with the patient.  Posterior and inferior calcaneal spurring is present.  Flatfoot deformities  present as well as bunion.  Osteopenia present.  No evidence of acute fracture. -Voltaren gel -Heel lift provided I also gave her a cushion to help take pressure off the heel spur. -Discussed stretching exercises daily.  Return in about 4 weeks (around 07/18/2018).  Trula Slade DPM

## 2018-07-18 ENCOUNTER — Ambulatory Visit: Payer: Medicare Other | Admitting: Podiatry

## 2020-02-18 ENCOUNTER — Other Ambulatory Visit: Payer: Self-pay | Admitting: *Deleted

## 2020-02-18 DIAGNOSIS — N185 Chronic kidney disease, stage 5: Secondary | ICD-10-CM

## 2020-02-26 ENCOUNTER — Ambulatory Visit (INDEPENDENT_AMBULATORY_CARE_PROVIDER_SITE_OTHER)
Admission: RE | Admit: 2020-02-26 | Discharge: 2020-02-26 | Disposition: A | Discharge status: Home / Self Care | Payer: Medicare Other | Source: Ambulatory Visit | Attending: Vascular Surgery | Admitting: Vascular Surgery

## 2020-02-26 ENCOUNTER — Ambulatory Visit (INDEPENDENT_AMBULATORY_CARE_PROVIDER_SITE_OTHER): Payer: Medicare Other | Admitting: Vascular Surgery

## 2020-02-26 ENCOUNTER — Ambulatory Visit (HOSPITAL_COMMUNITY)
Admission: RE | Admit: 2020-02-26 | Discharge: 2020-02-26 | Disposition: A | Discharge status: Home / Self Care | Payer: Medicare Other | Source: Ambulatory Visit | Attending: Vascular Surgery | Admitting: Vascular Surgery

## 2020-02-26 ENCOUNTER — Other Ambulatory Visit: Payer: Self-pay

## 2020-02-26 ENCOUNTER — Encounter: Payer: Self-pay | Admitting: Vascular Surgery

## 2020-02-26 VITALS — BP 162/79 | HR 70 | Resp 20 | Ht <= 58 in | Wt 159.0 lb

## 2020-02-26 DIAGNOSIS — N185 Chronic kidney disease, stage 5: Secondary | ICD-10-CM

## 2020-02-26 NOTE — Progress Notes (Signed)
Patient ID: Brandy Fuentes, female   DOB: February 10, 1929, 86 y.o.   MRN: [id number]  Reason for Consult: New Patient (Initial Visit)   Referred by Helane Rima, MD  Subjective:     HPI:  Brandy Fuentes is a 86 y.o. female with chronic kidney disease stage IV.  She is right-hand dominant.  She has never had chest, breast, upper extremity surgery denies any pacemaker placement or port placement.  She has never had dialysis.  She is here to discuss dialysis access.  Vein mapping performed prior to today's visit.  She denies taking any blood thinners.  Past Medical History:  Diagnosis Date  . Barrett's esophagus   . Chronic gastritis   . Chronic kidney disease   . Constipation   . Diverticulitis of sigmoid colon   . Hiatal hernia   . Hypertension   . Reflux    Family History  Problem Relation Age of Onset  . Heart disease Mother   . Heart disease Father   . Colon cancer Brother   . Colon polyps Brother   . Heart disease Brother   . Heart disease Maternal Uncle   . Breast cancer Paternal Aunt   . Esophageal cancer Paternal Aunt   . Heart disease Paternal Uncle    Past Surgical History:  Procedure Laterality Date  . ABDOMINAL HYSTERECTOMY    . BLADDER SUSPENSION    . COLONOSCOPY  11/22/00,07/11/05   Dr. Jim Desanctis  . ESOPHAGOGASTRODUODENOSCOPY  01/16/00,07/04/05   Dr. Jim Desanctis, with Dilation  . HERNIA REPAIR    . RECTOCELE REPAIR    . vault prolapse repair      Short Social History:  Social History   Tobacco Use  . Smoking status: Never Smoker  . Smokeless tobacco: Never Used  Substance Use Topics  . Alcohol use: No    Allergies  Allergen Reactions  . Ciprofloxacin     Made her feel crazy, per pt    Current Outpatient Medications  Medication Sig Dispense Refill  . amLODipine (NORVASC) 2.5 MG tablet TAKE 1 TABLET BY MOUTH EVERY DAY    . Cholecalciferol (VITAMIN D-3) 5000 units TABS Take 1 tablet by mouth daily.    . Cod Liver Oil (COD LIVER PO) Take 1  tablet by mouth daily.    . diclofenac sodium (VOLTAREN) 1 % GEL Apply 2 g topically 4 (four) times daily. Rub into affected area of foot 2 to 4 times daily 100 g 2  . ferrous sulfate 325 (65 FE) MG tablet Take by mouth.    Marland Kitchen FLUAD 0.5 ML SUSY ADM 0.5ML IM UTD  0  . furosemide (LASIX) 20 MG tablet TAKE 1 TABLET(20 MG) BY MOUTH DAILY AS NEEDED    . losartan (COZAAR) 100 MG tablet Take 100 mg by mouth daily.    . mesalamine (LIALDA) 1.2 g EC tablet Take 2.4 g by mouth daily.  12  . olmesartan (BENICAR) 20 MG tablet     . omeprazole (PRILOSEC) 20 MG capsule Take 20 mg by mouth daily.     No current facility-administered medications for this visit.    Review of Systems  HENT: HENT negative.  Eyes: Eyes negative.  Respiratory: Respiratory negative.  Cardiovascular: Positive for leg swelling.  GI: Gastrointestinal negative.  Musculoskeletal: Musculoskeletal negative.  Skin: Skin negative.  Neurological: Neurological negative. Hematologic: Hematologic/lymphatic negative.  Psychiatric: Psychiatric negative.        Objective:  Objective  Vitals:   02/26/20 1339  BP: Marland Kitchen)  162/79  Pulse: 70  Resp: 20  SpO2: 100%     Physical Exam HENT:     Head: Normocephalic.     Nose:     Comments: Wearing a mask Eyes:     Pupils: Pupils are equal, round, and reactive to light.  Cardiovascular:     Pulses: Normal pulses.  Abdominal:     General: Abdomen is flat.  Musculoskeletal:        General: No swelling. Normal range of motion.     Cervical back: Normal range of motion.  Skin:    General: Skin is warm and dry.     Capillary Refill: Capillary refill takes less than 2 seconds.  Neurological:     General: No focal deficit present.     Mental Status: She is alert.  Psychiatric:        Mood and Affect: Mood normal.        Behavior: Behavior normal.        Thought Content: Thought content normal.        Judgment: Judgment normal.     Data: I have independently interpreted her  upper extremity vein mapping.  She has suitable right cephalic marginal right basilic vein.  Left cephalic is marginal left basilic appear suitable above the antecubitum.  I have independent interpreted her upper extremity arterial duplex.  Right brachial artery 0.31 cm and triphasic left is 0.36 cm and triphasic.     Assessment/Plan:     86 year old female with chronic kidney disease stage IV here to discuss dialysis access.  She does appear to have suitable vein bilaterally she is right-hand dominant we would prefer to keep it on her left arm would likely need to stage basilic vein fistula and I discussed this with the patient and her daughter today.  She is unsure that she is going to proceed with dialysis she would like to discuss with Dr. Justin Mend prior and will call to schedule if it is something she is going to proceed with.  I discussed the options for hemodialysis being catheter, graft, fistula as well as the expected outcomes from the surgery and risk and benefits including steal, primary nonfunction and nerve injury.  They demonstrate good understanding.     Brandy Sandy MD [facility]

## 2020-11-13 ENCOUNTER — Other Ambulatory Visit: Payer: Self-pay

## 2020-11-13 ENCOUNTER — Encounter (HOSPITAL_COMMUNITY): Payer: Self-pay

## 2020-11-13 ENCOUNTER — Inpatient Hospital Stay (HOSPITAL_COMMUNITY)
Admission: EM | Admit: 2020-11-13 | Discharge: 2020-11-17 | DRG: 378 | Disposition: A | Discharge status: Home / Self Care | Payer: Medicare HMO | Attending: Family Medicine | Admitting: Family Medicine

## 2020-11-13 DIAGNOSIS — E875 Hyperkalemia: Secondary | ICD-10-CM | POA: Diagnosis present

## 2020-11-13 DIAGNOSIS — E669 Obesity, unspecified: Secondary | ICD-10-CM | POA: Diagnosis present

## 2020-11-13 DIAGNOSIS — K5733 Diverticulitis of large intestine without perforation or abscess with bleeding: Secondary | ICD-10-CM | POA: Diagnosis not present

## 2020-11-13 DIAGNOSIS — K5732 Diverticulitis of large intestine without perforation or abscess without bleeding: Secondary | ICD-10-CM | POA: Diagnosis present

## 2020-11-13 DIAGNOSIS — K2270 Barrett's esophagus without dysplasia: Secondary | ICD-10-CM | POA: Diagnosis present

## 2020-11-13 DIAGNOSIS — N185 Chronic kidney disease, stage 5: Secondary | ICD-10-CM | POA: Diagnosis present

## 2020-11-13 DIAGNOSIS — D649 Anemia, unspecified: Secondary | ICD-10-CM

## 2020-11-13 DIAGNOSIS — Z20822 Contact with and (suspected) exposure to covid-19: Secondary | ICD-10-CM | POA: Diagnosis present

## 2020-11-13 DIAGNOSIS — Z79899 Other long term (current) drug therapy: Secondary | ICD-10-CM

## 2020-11-13 DIAGNOSIS — K449 Diaphragmatic hernia without obstruction or gangrene: Secondary | ICD-10-CM | POA: Diagnosis present

## 2020-11-13 DIAGNOSIS — K625 Hemorrhage of anus and rectum: Secondary | ICD-10-CM

## 2020-11-13 DIAGNOSIS — I120 Hypertensive chronic kidney disease with stage 5 chronic kidney disease or end stage renal disease: Secondary | ICD-10-CM | POA: Diagnosis present

## 2020-11-13 DIAGNOSIS — K219 Gastro-esophageal reflux disease without esophagitis: Secondary | ICD-10-CM | POA: Diagnosis present

## 2020-11-13 DIAGNOSIS — Z8249 Family history of ischemic heart disease and other diseases of the circulatory system: Secondary | ICD-10-CM

## 2020-11-13 DIAGNOSIS — K922 Gastrointestinal hemorrhage, unspecified: Secondary | ICD-10-CM

## 2020-11-13 DIAGNOSIS — I10 Essential (primary) hypertension: Secondary | ICD-10-CM | POA: Diagnosis present

## 2020-11-13 DIAGNOSIS — K2950 Unspecified chronic gastritis without bleeding: Secondary | ICD-10-CM | POA: Diagnosis present

## 2020-11-13 DIAGNOSIS — E785 Hyperlipidemia, unspecified: Secondary | ICD-10-CM | POA: Diagnosis present

## 2020-11-13 DIAGNOSIS — K921 Melena: Secondary | ICD-10-CM

## 2020-11-13 DIAGNOSIS — Z881 Allergy status to other antibiotic agents status: Secondary | ICD-10-CM

## 2020-11-13 DIAGNOSIS — N179 Acute kidney failure, unspecified: Secondary | ICD-10-CM | POA: Diagnosis present

## 2020-11-13 DIAGNOSIS — Z6835 Body mass index (BMI) 35.0-35.9, adult: Secondary | ICD-10-CM

## 2020-11-13 DIAGNOSIS — D62 Acute posthemorrhagic anemia: Secondary | ICD-10-CM | POA: Diagnosis present

## 2020-11-13 DIAGNOSIS — Z8371 Family history of colonic polyps: Secondary | ICD-10-CM

## 2020-11-13 LAB — CBC WITH DIFFERENTIAL/PLATELET
Abs Immature Granulocytes: 0.11 K/uL — ABNORMAL HIGH (ref 0.00–0.07)
Basophils Absolute: 0 K/uL (ref 0.0–0.1)
Basophils Relative: 0 %
Eosinophils Absolute: 0 K/uL (ref 0.0–0.5)
Eosinophils Relative: 0 %
HCT: 35.9 % — ABNORMAL LOW (ref 36.0–46.0)
Hemoglobin: 11.5 g/dL — ABNORMAL LOW (ref 12.0–15.0)
Immature Granulocytes: 1 %
Lymphocytes Relative: 13 %
Lymphs Abs: 1.3 K/uL (ref 0.7–4.0)
MCH: 31.8 pg (ref 26.0–34.0)
MCHC: 32 g/dL (ref 30.0–36.0)
MCV: 99.2 fL (ref 80.0–100.0)
Monocytes Absolute: 0.7 K/uL (ref 0.1–1.0)
Monocytes Relative: 7 %
Neutro Abs: 8.2 K/uL — ABNORMAL HIGH (ref 1.7–7.7)
Neutrophils Relative %: 79 %
Platelets: 252 K/uL (ref 150–400)
RBC: 3.62 MIL/uL — ABNORMAL LOW (ref 3.87–5.11)
RDW: 12.9 % (ref 11.5–15.5)
WBC: 10.4 K/uL (ref 4.0–10.5)
nRBC: 0 % (ref 0.0–0.2)

## 2020-11-13 LAB — CBC
HCT: 33.7 % — ABNORMAL LOW (ref 36.0–46.0)
Hemoglobin: 10.4 g/dL — ABNORMAL LOW (ref 12.0–15.0)
MCH: 30.8 pg (ref 26.0–34.0)
MCHC: 30.9 g/dL (ref 30.0–36.0)
MCV: 99.7 fL (ref 80.0–100.0)
Platelets: 249 K/uL (ref 150–400)
RBC: 3.38 MIL/uL — ABNORMAL LOW (ref 3.87–5.11)
RDW: 13.2 % (ref 11.5–15.5)
WBC: 12 K/uL — ABNORMAL HIGH (ref 4.0–10.5)
nRBC: 0 % (ref 0.0–0.2)

## 2020-11-13 LAB — COMPREHENSIVE METABOLIC PANEL WITH GFR
ALT: 13 U/L (ref 0–44)
AST: 11 U/L — ABNORMAL LOW (ref 15–41)
Albumin: 3.4 g/dL — ABNORMAL LOW (ref 3.5–5.0)
Alkaline Phosphatase: 50 U/L (ref 38–126)
Anion gap: 10 (ref 5–15)
BUN: 73 mg/dL — ABNORMAL HIGH (ref 8–23)
CO2: 17 mmol/L — ABNORMAL LOW (ref 22–32)
Calcium: 8.1 mg/dL — ABNORMAL LOW (ref 8.9–10.3)
Chloride: 112 mmol/L — ABNORMAL HIGH (ref 98–111)
Creatinine, Ser: 2.99 mg/dL — ABNORMAL HIGH (ref 0.44–1.00)
GFR, Estimated: 14 mL/min — ABNORMAL LOW (ref >60)
Glucose, Bld: 98 mg/dL (ref 70–99)
Potassium: 4.5 mmol/L (ref 3.5–5.1)
Sodium: 139 mmol/L (ref 135–145)
Total Bilirubin: 0.6 mg/dL (ref 0.3–1.2)
Total Protein: 6.5 g/dL (ref 6.5–8.1)

## 2020-11-13 LAB — RESP PANEL BY RT-PCR (FLU A&B, COVID) ARPGX2
Influenza A by PCR: NEGATIVE
Influenza B by PCR: NEGATIVE
SARS Coronavirus 2 by RT PCR: NEGATIVE

## 2020-11-13 LAB — TYPE AND SCREEN
ABO/RH(D): AB POS
Antibody Screen: NEGATIVE

## 2020-11-13 LAB — LIPASE, BLOOD: Lipase: 63 U/L — ABNORMAL HIGH (ref 11–51)

## 2020-11-13 LAB — POC OCCULT BLOOD, ED: Fecal Occult Bld: POSITIVE — AB

## 2020-11-13 MED ORDER — SODIUM CHLORIDE 0.9 % IV SOLN: INTRAVENOUS | Status: AC

## 2020-11-13 MED ORDER — PANTOPRAZOLE SODIUM 40 MG IV SOLR
40.0000 mg | INTRAVENOUS | Status: DC
  Administered 2020-11-13: 40 mg via INTRAVENOUS
  Filled 2020-11-13 (×2): qty 40

## 2020-11-13 MED ORDER — SODIUM CHLORIDE 0.9 % IV BOLUS
500.0000 mL | Freq: Once | INTRAVENOUS | Status: AC
  Administered 2020-11-13: 500 mL via INTRAVENOUS

## 2020-11-13 MED ORDER — SODIUM CHLORIDE 0.9% FLUSH
3.0000 mL | Freq: Two times a day (BID) | INTRAVENOUS | Status: DC
  Administered 2020-11-15: 3 mL via INTRAVENOUS

## 2020-11-13 MED ORDER — METOPROLOL TARTRATE 5 MG/5ML IV SOLN: 5.0000 mg | Freq: Four times a day (QID) | INTRAVENOUS | Status: DC | PRN

## 2020-11-13 NOTE — H&P (Signed)
History and Physical        Hospital Admission Note Date: 11/13/2020  Patient name: De Witt record number: [id number] Date of birth: 1929/03/11 Age: 87 y.o. Gender: female  PCP: Corrington, Delsa Grana, MD   Chief Complaint    Chief Complaint  Patient presents with  . Rectal Bleeding      HPI:   This is a 87 year old female with past medical history of CKD 5 (not on dialysis), diverticulitis, IBD (noted in care everywhere though not specified), Barrett's esophagus, chronic gastritis, hiatal hernia, hypertension who presented to the ED with 1 day of bright and dark red painless rectal bleeding.  States she has had occasional bleeding over the past several weeks although over the past day it has become much more constant and severe.  Denies any NSAID use.  Denies nausea or vomiting.  No other complaints   ED Course: Afebrile, hemodynamically stable, on room air. Notable Labs: Sodium 139, K4.5, BUN 73, creatinine 2.99, Hb 11.5, platelets 252, FOBT positive, COVID-19 pending.  Patient received 500 cc NS bolus.     Vitals:   11/13/20 1615 11/13/20 1620  BP: 137/63   Pulse: 60   Resp: 19   Temp:  (!) 97.4 F (36.3 C)  SpO2: 99%      Review of Systems:  Review of Systems  All other systems reviewed and are negative.   Medical/Social/Family History   Past Medical History: Past Medical History:  Diagnosis Date  . Barrett's esophagus   . Chronic gastritis   . Chronic kidney disease   . Constipation   . Diverticulitis of sigmoid colon   . Hiatal hernia   . Hypertension   . Reflux     Past Surgical History:  Procedure Laterality Date  . ABDOMINAL HYSTERECTOMY    . BLADDER SUSPENSION    . COLONOSCOPY  11/22/00,07/11/05   Dr. Jim Desanctis  . ESOPHAGOGASTRODUODENOSCOPY  01/16/00,07/04/05   Dr. Jim Desanctis, with Dilation  . HERNIA REPAIR    . RECTOCELE REPAIR     . vault prolapse repair      Medications: Prior to Admission medications   Medication Sig Start Date End Date Taking? Authorizing Provider  amLODipine (NORVASC) 2.5 MG tablet TAKE 1 TABLET BY MOUTH EVERY DAY 04/10/18   [provider]  Cholecalciferol (VITAMIN D-3) 5000 units TABS Take 1 tablet by mouth daily.    [provider]  Cod Liver Oil (COD LIVER PO) Take 1 tablet by mouth daily.    [provider]  diclofenac sodium (VOLTAREN) 1 % GEL Apply 2 g topically 4 (four) times daily. Rub into affected area of foot 2 to 4 times daily 06/20/18   Trula Slade, DPM  ferrous sulfate 325 (65 FE) MG tablet Take by mouth.    [provider]  FLUAD 0.5 ML SUSY ADM 0.5ML IM UTD 05/21/18   [provider]  furosemide (LASIX) 20 MG tablet TAKE 1 TABLET(20 MG) BY MOUTH DAILY AS NEEDED 11/11/17   [provider]  losartan (COZAAR) 100 MG tablet Take 100 mg by mouth daily.    [provider]  mesalamine (LIALDA) 1.2 g EC tablet Take 2.4 g by mouth daily.  05/25/18   [provider]  olmesartan (BENICAR) 20 MG tablet  02/03/20   [provider]  omeprazole (PRILOSEC) 20 MG capsule Take 20 mg by mouth daily.    [provider]    Allergies:   Allergies  Allergen Reactions  . Ciprofloxacin     Made her feel crazy, per pt    Social History:  reports that she has never smoked. She has never used smokeless tobacco. She reports that she does not drink alcohol and does not use drugs.  Family History: Family History  Problem Relation Age of Onset  . Heart disease Mother   . Heart disease Father   . Colon cancer Brother   . Colon polyps Brother   . Heart disease Brother   . Heart disease Maternal Uncle   . Breast cancer Paternal Aunt   . Esophageal cancer Paternal Aunt   . Heart disease Paternal Uncle      Objective   Physical Exam: Blood pressure 137/63, pulse 60, temperature (!) 97.4 F (36.3 C),  temperature source Oral, resp. rate 19, height '4\' 8"'$  (1.422 m), weight 72 kg, SpO2 99 %.  Physical Exam Vitals and nursing note reviewed.  Constitutional:      Appearance: Normal appearance.  HENT:     Head: Normocephalic and atraumatic.  Eyes:     Conjunctiva/sclera: Conjunctivae normal.  Cardiovascular:     Rate and Rhythm: Normal rate and regular rhythm.  Pulmonary:     Effort: Pulmonary effort is normal.     Breath sounds: Normal breath sounds.  Abdominal:     General: Abdomen is flat.     Palpations: Abdomen is soft.     Tenderness: There is abdominal tenderness in the left lower quadrant.  Musculoskeletal:        General: No swelling or tenderness.  Skin:    Coloration: Skin is not jaundiced or pale.  Neurological:     Mental Status: She is alert. Mental status is at baseline.  Psychiatric:        Mood and Affect: Mood normal.        Behavior: Behavior normal.     LABS on Admission: I have personally reviewed all the labs and imaging below    Basic Metabolic Panel: Recent Labs  Lab 11/13/20 1212  NA 139  K 4.5  CL 112*  CO2 17*  GLUCOSE 98  BUN 73*  CREATININE 2.99*  CALCIUM 8.1*   Liver Function Tests: Recent Labs  Lab 11/13/20 1212  AST 11*  ALT 13  ALKPHOS 50  BILITOT 0.6  PROT 6.5  ALBUMIN 3.4*   Recent Labs  Lab 11/13/20 1212  LIPASE 63*   No results for input(s): AMMONIA in the last 168 hours. CBC: Recent Labs  Lab 11/13/20 1212 11/13/20 1600  WBC 10.4 12.0*  NEUTROABS 8.2*  --   HGB 11.5* 10.4*  HCT 35.9* 33.7*  MCV 99.2 99.7  PLT 252 249   Cardiac Enzymes: No results for input(s): CKTOTAL, CKMB, CKMBINDEX, TROPONINI in the last 168 hours. BNP: Invalid input(s): POCBNP CBG: No results for input(s): GLUCAP in the last 168 hours.  Radiological Exams on Admission:  No results found.    EKG: Not done   A & P   Active Problems:   Hypertension   Anemia   CKD (chronic kidney disease) stage 5, GFR less than 15 ml/min  (HCC)   GI bleed   1. Acute blood loss anemia secondary to GI bleed, suspect  diverticular a. Hemodynamically stable on room air with left lower quadrant pain to palpation  b. Has a history of IBD reported in care everywhere and on mesalamine though unsure what type and she is unsure of this c. Hb 11.5-> 10.4 d. GI consulted: NM RBC tagged scan to localize GI bleed, starting Protonix IV PPI daily, Dr. Benson Norway (patient's primary GI) will assume care tomorrow, trend H&H and transfuse for Hb<7.0 e. We will make n.p.o. for now and give IV fluids  2. Hypertension a. Hold olmesartan b. As needed hydralazine  3. Questionable history of IBD a. On chronic mesalamine b. Continue mesalamine when taking p.o.  4. CKD 5 a. Not on dialysis b. Creatinine 2.9 and likely at baseline, baseline 2.3 in 2014   DVT prophylaxis: SCDs   Code Status: Full Code  Diet: N.p.o. Family Communication: Admission, patients condition and plan of care including tests being ordered have been discussed with the patient who indicates understanding and agrees with the plan and Code Status. Patient's son was updated  Disposition Plan: The appropriate patient status for this patient is OBSERVATION. Observation status is judged to be reasonable and necessary in order to provide the required intensity of service to ensure the patient's safety. The patient's presenting symptoms, physical exam findings, and initial radiographic and laboratory data in the context of their medical condition is felt to place them at decreased risk for further clinical deterioration. Furthermore, it is anticipated that the patient will be medically stable for discharge from the hospital within 2 midnights of admission. The following factors support the patient status of observation.   " The patient's presenting symptoms include GI bleed. " The physical exam findings include left lower quadrant pain. " The initial radiographic and laboratory data are  anemia.      Consultants  . GI  Procedures  . None  Time Spent on Admission: 60 minutes    Harold Hedge, DO [facility]  11/13/2020, 5:13 PM

## 2020-11-13 NOTE — Progress Notes (Signed)
Pt has arrived to Room 1540 from ED. Alert and oriented. Family at bedside.

## 2020-11-13 NOTE — ED Provider Notes (Signed)
[facility] DEPT Provider Note   CSN: [id number] Arrival date & time: 11/13/20  1205     History Chief Complaint  Patient presents with  . Rectal Bleeding    Brandy Fuentes is a 87 y.o. female.  HPI 87 year old female with history of Barrett's esophagus, gastritis, CKD, constipation, diverticulitis of the colon, hypertension presents to the ER with complaints of 2 days of bright red and dark red rectal bleeding.  Patient states that she started to have a little bit of bleeding several days ago, which is progressively gotten worse.  She states she has "pouring blood "I have her anus now, filling up multiple toilet bowls.  She denies any did syncope.  She reports that she has had a history of this in the past, is followed by Dr. Benson Norway.  She cannot recall any findings of the colonoscopy.  Denies any significant abdominal pain, denies dysuria or hematuria.    Past Medical History:  Diagnosis Date  . Barrett's esophagus   . Chronic gastritis   . Chronic kidney disease   . Constipation   . Diverticulitis of sigmoid colon   . Hiatal hernia   . Hypertension   . Reflux     Patient Active Problem List   Diagnosis Date Noted  . Heel spur, left 06/22/2018  . Colon polyp 06/20/2018  . Diverticulitis of large intestine without perforation or abscess without bleeding 03/14/2015  . Refusal of statin medication by patient 03/14/2015  . Depression 03/09/2014  . Hyperlipidemia 09/08/2013  . Anemia 12/12/2012  . CKD (chronic kidney disease) stage 5, GFR less than 15 ml/min (HCC) 12/12/2012  . Lower extremity edema 12/12/2012  . Body aches 11/30/2012  . Edema 10/06/2012  . Dyspnea 09/08/2012  . Diverticulitis of sigmoid colon   . Barrett's esophagus   . Hypertension   . Fecal incontinence 06/25/2012    Past Surgical History:  Procedure Laterality Date  . ABDOMINAL HYSTERECTOMY    . BLADDER SUSPENSION    . COLONOSCOPY  11/22/00,07/11/05   Dr. Jim Desanctis   . ESOPHAGOGASTRODUODENOSCOPY  01/16/00,07/04/05   Dr. Jim Desanctis, with Dilation  . HERNIA REPAIR    . RECTOCELE REPAIR    . vault prolapse repair       OB History   No obstetric history on file.     Family History  Problem Relation Age of Onset  . Heart disease Mother   . Heart disease Father   . Colon cancer Brother   . Colon polyps Brother   . Heart disease Brother   . Heart disease Maternal Uncle   . Breast cancer Paternal Aunt   . Esophageal cancer Paternal Aunt   . Heart disease Paternal Uncle     Social History   Tobacco Use  . Smoking status: Never Smoker  . Smokeless tobacco: Never Used  Vaping Use  . Vaping Use: Never used  Substance Use Topics  . Alcohol use: No  . Drug use: No    Home Medications Prior to Admission medications   Medication Sig Start Date End Date Taking? Authorizing Provider  amLODipine (NORVASC) 2.5 MG tablet Take 2.5 mg by mouth daily. 04/10/18  Yes [provider]  mesalamine (LIALDA) 1.2 g EC tablet Take 2.4 g by mouth daily. 05/25/18  Yes [provider]  omeprazole (PRILOSEC) 20 MG capsule Take 20 mg by mouth daily.   Yes [provider]  Cholecalciferol (VITAMIN D-3) 5000 units TABS Take 1 tablet by mouth daily.  [provider]  Cod Liver Oil (COD LIVER PO) Take 1 tablet by mouth daily.    [provider]  diclofenac sodium (VOLTAREN) 1 % GEL Apply 2 g topically 4 (four) times daily. Rub into affected area of foot 2 to 4 times daily 06/20/18   Trula Slade, DPM  ferrous sulfate 325 (65 FE) MG tablet Take by mouth.    [provider]  FLUAD 0.5 ML SUSY ADM 0.5ML IM UTD 05/21/18   [provider]  losartan (COZAAR) 100 MG tablet Take 100 mg by mouth daily.    [provider]  olmesartan (BENICAR) 20 MG tablet  02/03/20   [provider]    Allergies    Ciprofloxacin  Review of Systems   Review of Systems  Constitutional: Negative for chills and  fever.  HENT: Negative for ear pain and sore throat.   Eyes: Negative for pain and visual disturbance.  Respiratory: Negative for cough and shortness of breath.   Cardiovascular: Negative for chest pain and palpitations.  Gastrointestinal: Positive for anal bleeding and blood in stool. Negative for abdominal pain and vomiting.  Genitourinary: Negative for dysuria and hematuria.  Musculoskeletal: Negative for arthralgias and back pain.  Skin: Negative for color change and rash.  Neurological: Negative for seizures and syncope.  All other systems reviewed and are negative.   Physical Exam Updated Vital Signs BP 135/63   Pulse 72   Temp 98.5 F (36.9 C)   Resp 20   Ht '4\' 8"'$  (1.422 m)   Wt 72 kg   SpO2 99%   BMI 35.59 kg/m   Physical Exam Vitals and nursing note reviewed.  Constitutional:      General: She is not in acute distress.    Appearance: She is well-developed.  HENT:     Head: Normocephalic and atraumatic.  Eyes:     Conjunctiva/sclera: Conjunctivae normal.  Cardiovascular:     Rate and Rhythm: Normal rate and regular rhythm.     Heart sounds: No murmur heard.   Pulmonary:     Effort: Pulmonary effort is normal. No respiratory distress.     Breath sounds: Normal breath sounds.  Abdominal:     Palpations: Abdomen is soft.     Tenderness: There is no abdominal tenderness.  Genitourinary:    Rectum: Guaiac result positive.     Comments: Patient's brief full of bright red blood.  Visible bright red blood surrounding the anus.  Hemoccult positive.  No visible external hemorrhoids. Musculoskeletal:        General: Normal range of motion.     Cervical back: Neck supple.  Skin:    General: Skin is warm and dry.  Neurological:     General: No focal deficit present.     Mental Status: She is alert and oriented to person, place, and time.  Psychiatric:        Mood and Affect: Mood normal.        Behavior: Behavior normal.     ED Results / Procedures /  Treatments   Labs (all labs ordered are listed, but only abnormal results are displayed) Labs Reviewed  CBC WITH DIFFERENTIAL/PLATELET - Abnormal; Notable for the following components:      Result Value   RBC 3.62 (*)    Hemoglobin 11.5 (*)    HCT 35.9 (*)    Neutro Abs 8.2 (*)    Abs Immature Granulocytes 0.11 (*)    All other components within normal limits  COMPREHENSIVE METABOLIC PANEL - Abnormal; Notable for the following components:   Chloride 112 (*)    CO2 17 (*)    BUN 73 (*)    Creatinine, Ser 2.99 (*)    Calcium 8.1 (*)    Albumin 3.4 (*)    AST 11 (*)    GFR, Estimated 14 (*)    All other components within normal limits  LIPASE, BLOOD - Abnormal; Notable for the following components:   Lipase 63 (*)    All other components within normal limits  POC OCCULT BLOOD, ED - Abnormal; Notable for the following components:   Fecal Occult Bld POSITIVE (*)    All other components within normal limits  RESP PANEL BY RT-PCR (FLU A&B, COVID) ARPGX2  TYPE AND SCREEN    EKG None  Radiology No results found.  Procedures Procedures   Medications Ordered in ED Medications  sodium chloride 0.9 % bolus 500 mL (500 mLs Intravenous New Bag/Given 11/13/20 1329)    ED Course  I have reviewed the triage vital signs and the nursing notes.  Pertinent labs & imaging results that were available during my care of the patient were reviewed by me and considered in my medical decision making (see chart for details).    MDM Rules/Calculators/A&P                         87 year old female with GI bleed.  On arrival, vitals reassuring, no evidence of hypotension, tachycardia or hypoxia.  Physical exam with soft nontender abdomen, very significant amount of frank red blood on exam noted.  No visible external hemorrhoids.  Her CBC has a hemoglobin 11.5, CMP with a creatinine of 2.99, patient with a history of CKD with a GFR of 14, per chart review, appears to be fairly at  baseline.  Consulted Dr. Silverio Decamp who will see and evaluate the patient.  Plan for admission.  Patient typed and crossed, no need for transfusion at this time.  Consulted hospitalist team who will admit the patient for further evaluation and treatment.  This was a shared visit with my supervising physician Dr. Joya Gaskins who independently saw and evaluated the patient & provided guidance in evaluation/management/disposition ,in agreement with care  Final Clinical Impression(s) / ED Diagnoses Final diagnoses:  Gastrointestinal hemorrhage, unspecified gastrointestinal hemorrhage type    Rx / DC Orders ED Discharge Orders    None       Lyndel Safe 11/13/20 1430    Arnaldo Natal, MD 11/13/20 [phone]

## 2020-11-13 NOTE — ED Notes (Signed)
Consult to Gastroenterology'@13'$ :10pm.

## 2020-11-13 NOTE — ED Notes (Signed)
ED TO INPATIENT HANDOFF REPORT  Name/Age/Gender Brandy Fuentes 87 y.o. female  Code Status    Code Status Orders  (From admission, onward)         Start     Ordered   11/13/20 1537  Full code  Continuous        11/13/20 1536        Code Status History    This patient has a current code status but no historical code status.   Advance Care Planning Activity      Home/SNF/Other home  Chief Complaint GI bleed [K92.2]  Level of Care/Admitting Diagnosis ED Disposition    ED Disposition Condition Comment   Admit  Hospital Area: [facility] [id number]  Level of Care: Telemetry [5]  Admit to tele based on following criteria: Monitor for Ischemic changes  Covid Evaluation: Asymptomatic Screening Protocol (No Symptoms)  Diagnosis: GI bleed [id number]  Admitting Physician: Harold Hedge [id number]  Attending Physician: Harold Hedge [id number]       Medical History Past Medical History:  Diagnosis Date  . Barrett's esophagus   . Chronic gastritis   . Chronic kidney disease   . Constipation   . Diverticulitis of sigmoid colon   . Hiatal hernia   . Hypertension   . Reflux     Allergies Allergies  Allergen Reactions  . Ciprofloxacin     Made her feel crazy, per pt    IV Location/Drains/Wounds Patient Lines/Drains/Airways Status    Active Line/Drains/Airways    Name Placement date Placement time Site Days   Peripheral IV 11/13/20 Right Antecubital 11/13/20  1229  Antecubital  less than 1          Labs/Imaging Results for orders placed or performed during the hospital encounter of 11/13/20 (from the past 48 hour(s))  CBC with Differential     Status: Abnormal   Collection Time: 11/13/20 12:12 PM  Result Value Ref Range   WBC 10.4 4.0 - 10.5 K/uL   RBC 3.62 (L) 3.87 - 5.11 MIL/uL   Hemoglobin 11.5 (L) 12.0 - 15.0 g/dL   HCT 35.9 (L) 36.0 - 46.0 %   MCV 99.2 80.0 - 100.0 fL   MCH 31.8 26.0 - 34.0 pg   MCHC 32.0 30.0 - 36.0 g/dL    RDW 12.9 11.5 - 15.5 %   Platelets 252 150 - 400 K/uL   nRBC 0.0 0.0 - 0.2 %   Neutrophils Relative % 79 %   Neutro Abs 8.2 (H) 1.7 - 7.7 K/uL   Lymphocytes Relative 13 %   Lymphs Abs 1.3 0.7 - 4.0 K/uL   Monocytes Relative 7 %   Monocytes Absolute 0.7 0.1 - 1.0 K/uL   Eosinophils Relative 0 %   Eosinophils Absolute 0.0 0.0 - 0.5 K/uL   Basophils Relative 0 %   Basophils Absolute 0.0 0.0 - 0.1 K/uL   Immature Granulocytes 1 %   Abs Immature Granulocytes 0.11 (H) 0.00 - 0.07 K/uL    Comment: Performed at [facility], [place] [address], [place], [place] [address]  Comprehensive metabolic panel     Status: Abnormal   Collection Time: 11/13/20 12:12 PM  Result Value Ref Range   Sodium 139 135 - 145 mmol/L   Potassium 4.5 3.5 - 5.1 mmol/L   Chloride 112 (H) 98 - 111 mmol/L   CO2 17 (L) 22 - 32 mmol/L   Glucose, Bld 98 70 - 99 mg/dL    Comment: Glucose  reference range applies only to samples taken after fasting for at least 8 hours.   BUN 73 (H) 8 - 23 mg/dL   Creatinine, Ser 2.99 (H) 0.44 - 1.00 mg/dL   Calcium 8.1 (L) 8.9 - 10.3 mg/dL   Total Protein 6.5 6.5 - 8.1 g/dL   Albumin 3.4 (L) 3.5 - 5.0 g/dL   AST 11 (L) 15 - 41 U/L   ALT 13 0 - 44 U/L   Alkaline Phosphatase 50 38 - 126 U/L   Total Bilirubin 0.6 0.3 - 1.2 mg/dL   GFR, Estimated 14 (L) >60 mL/min    Comment: (NOTE) Calculated using the CKD-EPI Creatinine Equation (2021)    Anion gap 10 5 - 15    Comment: Performed at [facility], [place] [address] [place], [place] [address]  Lipase, blood     Status: Abnormal   Collection Time: 11/13/20 12:12 PM  Result Value Ref Range   Lipase 63 (H) 11 - 51 U/L    Comment: Performed at [facility], [place] [address] [place], [place] [address]  Type and screen [place]     Status: None   Collection Time: 11/13/20 12:20 PM  Result Value Ref Range   ABO/RH(D) AB POS    Antibody Screen NEG    Sample  Expiration      11/16/2020,2359 Performed at [facility], [place] [address], [place], [place] [address]   POC occult blood, ED     Status: Abnormal   Collection Time: 11/13/20 12:25 PM  Result Value Ref Range   Fecal Occult Bld POSITIVE (A) NEGATIVE  Resp Panel by RT-PCR (Flu A&B, Covid) Nasopharyngeal Swab     Status: None   Collection Time: 11/13/20  1:50 PM   Specimen: Nasopharyngeal Swab; Nasopharyngeal(NP) swabs in vial transport medium  Result Value Ref Range   SARS Coronavirus 2 by RT PCR NEGATIVE NEGATIVE    Comment: (NOTE) SARS-CoV-2 target nucleic acids are NOT DETECTED.  The SARS-CoV-2 RNA is generally detectable in upper respiratory specimens during the acute phase of infection. The lowest concentration of SARS-CoV-2 viral copies this assay can detect is 138 copies/mL. A negative result does not preclude SARS-Cov-2 infection and should not be used as the sole basis for treatment or other patient management decisions. A negative result may occur with  improper specimen collection/handling, submission of specimen other than nasopharyngeal swab, presence of viral mutation(s) within the areas targeted by this assay, and inadequate number of viral copies(<138 copies/mL). A negative result must be combined with clinical observations, patient history, and epidemiological information. The expected result is Negative.  Fact Sheet for Patients:  EntrepreneurPulse.com.au  Fact Sheet for Healthcare Providers:  IncredibleEmployment.be  This test is no t yet approved or cleared by the [place] FDA and  has been authorized for detection and/or diagnosis of SARS-CoV-2 by FDA under an Emergency Use Authorization (EUA). This EUA will remain  in effect (meaning this test can be used) for the duration of the COVID-19 declaration under Section 564(b)(1) of the Act, 21 U.S.C.section 360bbb-3(b)(1), unless the authorization is  terminated  or revoked sooner.       Influenza A by PCR NEGATIVE NEGATIVE   Influenza B by PCR NEGATIVE NEGATIVE    Comment: (NOTE) The Xpert Xpress SARS-CoV-2/FLU/RSV plus assay is intended as an aid in the diagnosis of influenza from Nasopharyngeal swab specimens and should not be used as a sole basis for treatment. Nasal washings and  aspirates are unacceptable for Xpert Xpress SARS-CoV-2/FLU/RSV testing.  Fact Sheet for Patients: EntrepreneurPulse.com.au  Fact Sheet for Healthcare Providers: IncredibleEmployment.be  This test is not yet approved or cleared by the [place] FDA and has been authorized for detection and/or diagnosis of SARS-CoV-2 by FDA under an Emergency Use Authorization (EUA). This EUA will remain in effect (meaning this test can be used) for the duration of the COVID-19 declaration under Section 564(b)(1) of the Act, 21 U.S.C. section 360bbb-3(b)(1), unless the authorization is terminated or revoked.  Performed at [facility], [place] [address], [place], [place] [address]   CBC Once     Status: Abnormal   Collection Time: 11/13/20  4:00 PM  Result Value Ref Range   WBC 12.0 (H) 4.0 - 10.5 K/uL   RBC 3.38 (L) 3.87 - 5.11 MIL/uL   Hemoglobin 10.4 (L) 12.0 - 15.0 g/dL   HCT 33.7 (L) 36.0 - 46.0 %   MCV 99.7 80.0 - 100.0 fL   MCH 30.8 26.0 - 34.0 pg   MCHC 30.9 30.0 - 36.0 g/dL   RDW 13.2 11.5 - 15.5 %   Platelets 249 150 - 400 K/uL   nRBC 0.0 0.0 - 0.2 %    Comment: Performed at [facility], [place] [address], [place],  [address]   No results found.  Pending Labs Unresulted Labs (From admission, onward)          Start     Ordered   11/14/20 [id number]  Basic metabolic panel  Daily,   R      11/13/20 1536   11/14/20 0500  CBC  Daily,   R      11/13/20 1536   11/13/20 1600  CBC Once  Now then every 8 hours,   R (with STAT occurrences)      11/13/20 1534           Vitals/Pain Today's Vitals   11/13/20 1445 11/13/20 1545 11/13/20 1615 11/13/20 1620  BP: 118/82 (!) 148/63 137/63   Pulse: 63 62 60   Resp: 20  19   Temp:    (!) 97.4 F (36.3 C)  TempSrc:    Oral  SpO2: 100% 100% 99%   Weight:      Height:      PainSc:        Isolation Precautions No active isolations  Medications Medications  pantoprazole (PROTONIX) injection 40 mg (40 mg Intravenous Given 11/13/20 1600)  sodium chloride flush (NS) 0.9 % injection 3 mL (3 mLs Intravenous Not Given 11/13/20 1547)  0.9 %  sodium chloride infusion ( Intravenous New Bag/Given 11/13/20 1600)  metoprolol tartrate (LOPRESSOR) injection 5 mg (has no administration in time range)  sodium chloride 0.9 % bolus 500 mL (0 mLs Intravenous Stopped 11/13/20 1619)    Mobility ambulatory

## 2020-11-13 NOTE — Consult Note (Addendum)
Referring Provider:  [facility]         Primary Care Physician:  Curly Rim, MD Primary Gastroenterologist: Carol Ada, MD              We were asked to see this patient for:   Rectal bleeding               Attending physician's note   I have taken a history, examined the patient and reviewed the chart. I agree with the Advanced Practitioner's note, impression and recommendations.  87 yr F presented to ER with painless hematochezia.  Her presentation is concerning for acute diverticular hemorrhage.  She has mild left lower quadrant tenderness Monitor CBC and transfuse to hemoglobin greater than 7 Will obtain nuclear med RBC tag scan to localize the area of GI bleed for more targeted therapy According to patient she has had endoscopic evaluation and colonoscopy in the past 2 years by Dr. Benson Norway in the outpatient setting, cannot access the reports to review She is on chronic mesalamine therapy, ?  Unclear she has history of IBD or not Continue supportive care Dr. Collene Mares Dr. Benson Norway will resume care tomorrow, please call with any questions Clear liquids   The patient was provided an opportunity to ask questions and all were answered. The patient agreed with the plan and demonstrated an understanding of the instructions.  Damaris Hippo , MD [phone]    ASSESSMENT / PLAN:   # 87 yo female with painless hematochezia. Presentation seems most compatible with a diverticular hemorrhage though she is tender in LLQ so ischemic colitis is a consideration. Also, if she has IBD ( on Lialda) she could be bleeding from that though the amount of blood loss / clots makes this unlikely. Brisk upper GI bleed a distant possibility given that her elevation in BUN seems out of proportion to Creatinine but she has been hemodynamically stable so this to seems unlikely.  --Patient will be having a tagged RBC scan, will see what that shows. Supportive care in the interim --Monitor hgb closely,  transfuse if needed.  --For precautionary measures will start IV PPI 24 hours --Will see what tagged scan shows. Dr. Benson Norway ( patient's primary GI) will assume care tomorrow.    # ? Hx of IBD. She takes Mesalamine at home.  --Continue Mesalamine    HPI:                                                                                                                             Chief Complaint: rectal bleeding  Brandy Fuentes is a 87 y.o. female with history Barrett's esophagus, gastritis, diverticulitis, HTN, CKD  Patient presented to ED today for evaluation of rectal bleeding.  She is hemodynamically stable. Hgb is 11.5, now labs in Epic to compare. Lipase 63, liver chemistries unremarkable.  Patient is on iron at home.    Ms Appel began having painless rectal bleeding yesterday. She  was passing both black and red blood. She has had rectal bleeding before but never this much blood and the bleeding is generally self -limiting. She hasn't had any associated nausea / vomiting. She doesn't take NSAIDs. Patient is on Lialda for "inflammed bowel". I do not have Dr. Ulyses Amor records but patient says she was prescribed Lialda a couple of years ago but hasn't taken it consistently. It sounds like her last colonoscopy was ~ two years ago and some polyps may have been removed. At baseline her bowel movements are sometimes loose and urgent.     Past Medical History:  Diagnosis Date  . Barrett's esophagus   . Chronic gastritis   . Chronic kidney disease   . Constipation   . Diverticulitis of sigmoid colon   . Hiatal hernia   . Hypertension   . Reflux     Past Surgical History:  Procedure Laterality Date  . ABDOMINAL HYSTERECTOMY    . BLADDER SUSPENSION    . COLONOSCOPY  11/22/00,07/11/05   Dr. Jim Desanctis  . ESOPHAGOGASTRODUODENOSCOPY  01/16/00,07/04/05   Dr. Jim Desanctis, with Dilation  . HERNIA REPAIR    . RECTOCELE REPAIR    . vault prolapse repair      Prior to Admission medications    Medication Sig Start Date End Date Taking? Authorizing Provider  amLODipine (NORVASC) 2.5 MG tablet Take 2.5 mg by mouth daily. 04/10/18  Yes [provider]  mesalamine (LIALDA) 1.2 g EC tablet Take 2.4 g by mouth daily. 05/25/18  Yes [provider]  omeprazole (PRILOSEC) 20 MG capsule Take 20 mg by mouth daily.   Yes [provider]  Cholecalciferol (VITAMIN D-3) 5000 units TABS Take 1 tablet by mouth daily.    [provider]  Cod Liver Oil (COD LIVER PO) Take 1 tablet by mouth daily.    [provider]  diclofenac sodium (VOLTAREN) 1 % GEL Apply 2 g topically 4 (four) times daily. Rub into affected area of foot 2 to 4 times daily 06/20/18   Trula Slade, DPM  ferrous sulfate 325 (65 FE) MG tablet Take by mouth.    [provider]  FLUAD 0.5 ML SUSY ADM 0.5ML IM UTD 05/21/18   [provider]  losartan (COZAAR) 100 MG tablet Take 100 mg by mouth daily.    [provider]  olmesartan (BENICAR) 20 MG tablet  02/03/20   [provider]    No current facility-administered medications for this encounter.   Current Outpatient Medications  Medication Sig Dispense Refill  . amLODipine (NORVASC) 2.5 MG tablet Take 2.5 mg by mouth daily.    . mesalamine (LIALDA) 1.2 g EC tablet Take 2.4 g by mouth daily.  12  . omeprazole (PRILOSEC) 20 MG capsule Take 20 mg by mouth daily.    . Cholecalciferol (VITAMIN D-3) 5000 units TABS Take 1 tablet by mouth daily.    . Cod Liver Oil (COD LIVER PO) Take 1 tablet by mouth daily.    . diclofenac sodium (VOLTAREN) 1 % GEL Apply 2 g topically 4 (four) times daily. Rub into affected area of foot 2 to 4 times daily 100 g 2  . ferrous sulfate 325 (65 FE) MG tablet Take by mouth.    Marland Kitchen FLUAD 0.5 ML SUSY ADM 0.5ML IM UTD  0  . losartan (COZAAR) 100 MG tablet Take 100 mg by mouth daily.    Marland Kitchen olmesartan (BENICAR) 20 MG tablet       Allergies  as of 11/13/2020 - Review Complete 11/13/2020   Allergen Reaction Noted  . Ciprofloxacin  04/22/2015    Family History  Problem Relation Age of Onset  . Heart disease Mother   . Heart disease Father   . Colon cancer Brother   . Colon polyps Brother   . Heart disease Brother   . Heart disease Maternal Uncle   . Breast cancer Paternal Aunt   . Esophageal cancer Paternal Aunt   . Heart disease Paternal Uncle     Social History   Socioeconomic History  . Marital status: Married    Spouse name: Not on file  . Number of children: 2  . Years of education: Not on file  . Highest education level: Not on file  Occupational History  . Occupation: Retired  Tobacco Use  . Smoking status: Never Smoker  . Smokeless tobacco: Never Used  Vaping Use  . Vaping Use: Never used  Substance and Sexual Activity  . Alcohol use: No  . Drug use: No  . Sexual activity: Not on file  Other Topics Concern  . Not on file  Social History Narrative  . Not on file   Social Determinants of Health   Financial Resource Strain: Not on file  Food Insecurity: Not on file  Transportation Needs: Not on file  Physical Activity: Not on file  Stress: Not on file  Social Connections: Not on file  Intimate Partner Violence: Not on file    Review of Systems: All systems reviewed and negative except where noted in HPI.  OBJECTIVE:    Physical Exam: Vital signs in last 24 hours: Temp:  [98.5 F (36.9 C)] 98.5 F (36.9 C) (03/13 1235) Pulse Rate:  [57-72] 72 (03/13 1415) Resp:  [17-21] 20 (03/13 1415) BP: (131-149)/(55-88) 135/63 (03/13 1415) SpO2:  [94 %-100 %] 99 % (03/13 1415) Weight:  [72 kg] 72 kg (03/13 1213)   General:   Alert  female in NAD Psych:  Pleasant, cooperative. Normal mood and affect. Eyes:  Pupils equal, sclera clear, no icterus.   Conjunctiva pink. Ears:  Normal auditory acuity. Nose:  No deformity, discharge,  or lesions. Neck:  Supple; no masses Lungs:  Clear throughout to auscultation.   No wheezes, crackles, or  rhonchi.  Heart:  Regular rate and rhythm; no murmurs, no lower extremity edema Abdomen:  Soft, non-distended, nontender, BS active, no palp mass   Rectal:  Red blood inside vault.   Msk:  Symmetrical without gross deformities. . Neurologic:  Alert and  oriented x4;  grossly normal neurologically. Skin:  Intact without significant lesions or rashes.  Filed Weights   11/13/20 1213  Weight: 72 kg     Intake/Output from previous day: No intake/output data recorded. Intake/Output this shift: No intake/output data recorded.   Lab Results: Recent Labs    11/13/20 1212  WBC 10.4  HGB 11.5*  HCT 35.9*  PLT 252   BMET Recent Labs    11/13/20 1212  NA 139  K 4.5  CL 112*  CO2 17*  GLUCOSE 98  BUN 73*  CREATININE 2.99*  CALCIUM 8.1*   LFT Recent Labs    11/13/20 1212  PROT 6.5  ALBUMIN 3.4*  AST 11*  ALT 13  ALKPHOS 50  BILITOT 0.6   PT/INR No results for input(s): LABPROT, INR in the last 72 hours. Hepatitis Panel No results for input(s): HEPBSAG, HCVAB, HEPAIGM, HEPBIGM in the last 72 hours.   . CBC Latest Ref Rng &  Units 11/13/2020  WBC 4.0 - 10.5 K/uL 10.4  Hemoglobin 12.0 - 15.0 g/dL 11.5(L)  Hematocrit 36.0 - 46.0 % 35.9(L)  Platelets 150 - 400 K/uL 252    . CMP Latest Ref Rng & Units 11/13/2020 11/26/2012 10/30/2012  Glucose 70 - 99 mg/dL 98 94 95  BUN 8 - 23 mg/dL 73(H) 29(H) 32(H)  Creatinine 0.44 - 1.00 mg/dL 2.99(H) 2.3(H) 2.3(H)  Sodium 135 - 145 mmol/L 139 141 138  Potassium 3.5 - 5.1 mmol/L 4.5 3.9 3.8  Chloride 98 - 111 mmol/L 112(H) 108 104  CO2 22 - 32 mmol/L 17(L) 26 24  Calcium 8.9 - 10.3 mg/dL 8.1(L) 9.2 9.4  Total Protein 6.5 - 8.1 g/dL 6.5 - -  Total Bilirubin 0.3 - 1.2 mg/dL 0.6 - -  Alkaline Phos 38 - 126 U/L 50 - -  AST 15 - 41 U/L 11(L) - -  ALT 0 - 44 U/L 13 - -   Studies/Results: No results found.  Active Problems:   * No active hospital problems. Tye Savoy, NP-C @  11/13/2020, 2:33 PM

## 2020-11-13 NOTE — ED Triage Notes (Signed)
Rectal bleeding x1 day, described as dark in color. Denies abd pain. Pt states hx of GI bleed

## 2020-11-14 ENCOUNTER — Observation Stay (HOSPITAL_COMMUNITY): Payer: Medicare HMO

## 2020-11-14 DIAGNOSIS — K5732 Diverticulitis of large intestine without perforation or abscess without bleeding: Secondary | ICD-10-CM

## 2020-11-14 DIAGNOSIS — K922 Gastrointestinal hemorrhage, unspecified: Secondary | ICD-10-CM | POA: Diagnosis present

## 2020-11-14 DIAGNOSIS — K2270 Barrett's esophagus without dysplasia: Secondary | ICD-10-CM

## 2020-11-14 DIAGNOSIS — N185 Chronic kidney disease, stage 5: Secondary | ICD-10-CM | POA: Diagnosis not present

## 2020-11-14 DIAGNOSIS — D62 Acute posthemorrhagic anemia: Secondary | ICD-10-CM | POA: Diagnosis not present

## 2020-11-14 DIAGNOSIS — E669 Obesity, unspecified: Secondary | ICD-10-CM | POA: Diagnosis present

## 2020-11-14 LAB — CBC
HCT: 29.7 % — ABNORMAL LOW (ref 36.0–46.0)
HCT: 29.6 % — ABNORMAL LOW (ref 36.0–46.0)
HCT: 30.6 % — ABNORMAL LOW (ref 36.0–46.0)
Hemoglobin: 9.2 g/dL — ABNORMAL LOW (ref 12.0–15.0)
Hemoglobin: 9.2 g/dL — ABNORMAL LOW (ref 12.0–15.0)
Hemoglobin: 9.4 g/dL — ABNORMAL LOW (ref 12.0–15.0)
MCH: 30.2 pg (ref 26.0–34.0)
MCH: 31 pg (ref 26.0–34.0)
MCH: 30.3 pg (ref 26.0–34.0)
MCHC: 31 g/dL (ref 30.0–36.0)
MCHC: 31.1 g/dL (ref 30.0–36.0)
MCHC: 30.7 g/dL (ref 30.0–36.0)
MCV: 97.4 fL (ref 80.0–100.0)
MCV: 99.7 fL (ref 80.0–100.0)
MCV: 98.7 fL (ref 80.0–100.0)
Platelets: 206 K/uL (ref 150–400)
Platelets: 202 K/uL (ref 150–400)
Platelets: 225 K/uL (ref 150–400)
RBC: 3.05 MIL/uL — ABNORMAL LOW (ref 3.87–5.11)
RBC: 2.97 MIL/uL — ABNORMAL LOW (ref 3.87–5.11)
RBC: 3.1 MIL/uL — ABNORMAL LOW (ref 3.87–5.11)
RDW: 13 % (ref 11.5–15.5)
RDW: 13.1 % (ref 11.5–15.5)
RDW: 13 % (ref 11.5–15.5)
WBC: 9.6 K/uL (ref 4.0–10.5)
WBC: 8.6 K/uL (ref 4.0–10.5)
WBC: 9 K/uL (ref 4.0–10.5)
nRBC: 0 % (ref 0.0–0.2)
nRBC: 0 % (ref 0.0–0.2)
nRBC: 0 % (ref 0.0–0.2)

## 2020-11-14 LAB — BASIC METABOLIC PANEL WITH GFR
Anion gap: 7 (ref 5–15)
BUN: 62 mg/dL — ABNORMAL HIGH (ref 8–23)
CO2: 15 mmol/L — ABNORMAL LOW (ref 22–32)
Calcium: 7.7 mg/dL — ABNORMAL LOW (ref 8.9–10.3)
Chloride: 117 mmol/L — ABNORMAL HIGH (ref 98–111)
Creatinine, Ser: 2.56 mg/dL — ABNORMAL HIGH (ref 0.44–1.00)
GFR, Estimated: 17 mL/min — ABNORMAL LOW (ref >60)
Glucose, Bld: 94 mg/dL (ref 70–99)
Potassium: 4.7 mmol/L (ref 3.5–5.1)
Sodium: 139 mmol/L (ref 135–145)

## 2020-11-14 MED ORDER — SODIUM CHLORIDE 0.9 % IV SOLN: INTRAVENOUS | Status: DC

## 2020-11-14 MED ORDER — TECHNETIUM TC 99M-LABELED RED BLOOD CELLS IV KIT
24.1000 | PACK | Freq: Once | INTRAVENOUS | Status: AC
  Administered 2020-11-14: 24.1 via INTRAVENOUS

## 2020-11-14 MED ORDER — PEG 3350-KCL-NA BICARB-NACL 420 G PO SOLR
4000.0000 mL | Freq: Once | ORAL | Status: AC
  Administered 2020-11-14: 4000 mL via ORAL

## 2020-11-14 NOTE — Consult Note (Addendum)
Reason for Consult: Rectal bleeding with posthemorrhagic anemia. Referring Physician: [facility]  Brandy Fuentes is an 87 y.o. female.  HPI: Brandy Fuentes is a 87 year old white female with multiple medical problems listed below, including stage V kidney disease [not on hemodialysis], Barrett's esophagus, GERD, hiatal hernia, hypertension and sigmoid diverticulosis  presented to the emergency room at [facility] with a history of painless rectal bleeding since Saturday 11/12/2020. She claims he started bleeding Saturday night and has been bleeding since then every time she gets up out of bed she noticed large amount of blood in her underclothes.  She denies being weak or dizzy. There is no history of syncope or near syncope. She denies the use of an NSAIDS as she has stage IV kidney disease. She also has a history of colitis/proctitis requiring the use of mesalamine which has been difficult for her to afford and therefore she is only taking 1 pill every other day instead of 2 pills every day for maintenance dual purposes.  She denies having any nausea, vomiting, abdominal pain or fevers. She is on omeprazole for acid reflux. She has occasional problems with constipation.  Her last colonoscopy was done by Dr. Benson Norway on 8 04/24/2018 revealed scattered diverticulosis in the sigmoid colon along with chronic active proctitis. Her bleeding scan done earlier today was essentially unrevealing; no source of bleeding could be identified.  Her hemoglobin in the ER was 11.5 with a creatinine of 2.9.  Past Medical History:  Diagnosis Date  . Barrett's esophagus   . Chronic gastritis   . Chronic kidney disease   . Constipation   . Colonic diverticulosis   . Hiatal hernia   . Hypertension   . Reflux    Past Surgical History:  Procedure Laterality Date  . ABDOMINAL HYSTERECTOMY    . BLADDER SUSPENSION    . COLONOSCOPY  11/22/00,07/11/05   Dr. Jim Desanctis  . ESOPHAGOGASTRODUODENOSCOPY  01/16/00,07/04/05    Dr. Jim Desanctis, with Dilation  . HERNIA REPAIR    . RECTOCELE REPAIR    . vault prolapse repair     Family History  Problem Relation Age of Onset  . Heart disease Mother   . Heart disease Father   . Colon cancer Brother   . Colon polyps Brother   . Heart disease Brother   . Heart disease Maternal Uncle   . Breast cancer Paternal Aunt   . Esophageal cancer Paternal Aunt   . Heart disease Paternal Uncle    Social History:  reports that she has never smoked. She has never used smokeless tobacco. She reports that she does not drink alcohol and does not use drugs.  Allergies:  Allergies  Allergen Reactions  . Ciprofloxacin     Made her feel crazy, per pt   Medications: I have reviewed the patient's current medications.  Results for orders placed or performed during the hospital encounter of 11/13/20 (from the past 48 hour(s))  CBC with Differential     Status: Abnormal   Collection Time: 11/13/20 12:12 PM  Result Value Ref Range   WBC 10.4 4.0 - 10.5 K/uL   RBC 3.62 (L) 3.87 - 5.11 MIL/uL   Hemoglobin 11.5 (L) 12.0 - 15.0 g/dL   HCT 35.9 (L) 36.0 - 46.0 %   MCV 99.2 80.0 - 100.0 fL   MCH 31.8 26.0 - 34.0 pg   MCHC 32.0 30.0 - 36.0 g/dL   RDW 12.9 11.5 - 15.5 %   Platelets 252 150 -  400 K/uL   nRBC 0.0 0.0 - 0.2 %   Neutrophils Relative % 79 %   Neutro Abs 8.2 (H) 1.7 - 7.7 K/uL   Lymphocytes Relative 13 %   Lymphs Abs 1.3 0.7 - 4.0 K/uL   Monocytes Relative 7 %   Monocytes Absolute 0.7 0.1 - 1.0 K/uL   Eosinophils Relative 0 %   Eosinophils Absolute 0.0 0.0 - 0.5 K/uL   Basophils Relative 0 %   Basophils Absolute 0.0 0.0 - 0.1 K/uL   Immature Granulocytes 1 %   Abs Immature Granulocytes 0.11 (H) 0.00 - 0.07 K/uL    Comment: Performed at [facility], [place] [address], [place], [place] [address]  Comprehensive metabolic panel     Status: Abnormal   Collection Time: 11/13/20 12:12 PM  Result Value Ref Range   Sodium 139 135 - 145 mmol/L    Potassium 4.5 3.5 - 5.1 mmol/L   Chloride 112 (H) 98 - 111 mmol/L   CO2 17 (L) 22 - 32 mmol/L   Glucose, Bld 98 70 - 99 mg/dL    Comment: Glucose reference range applies only to samples taken after fasting for at least 8 hours.   BUN 73 (H) 8 - 23 mg/dL   Creatinine, Ser 2.99 (H) 0.44 - 1.00 mg/dL   Calcium 8.1 (L) 8.9 - 10.3 mg/dL   Total Protein 6.5 6.5 - 8.1 g/dL   Albumin 3.4 (L) 3.5 - 5.0 g/dL   AST 11 (L) 15 - 41 U/L   ALT 13 0 - 44 U/L   Alkaline Phosphatase 50 38 - 126 U/L   Total Bilirubin 0.6 0.3 - 1.2 mg/dL   GFR, Estimated 14 (L) >60 mL/min    Comment: (NOTE) Calculated using the CKD-EPI Creatinine Equation (2021)    Anion gap 10 5 - 15    Comment: Performed at [facility], [place] [address], [place], [place] [address]  Lipase, blood     Status: Abnormal   Collection Time: 11/13/20 12:12 PM  Result Value Ref Range   Lipase 63 (H) 11 - 51 U/L    Comment: Performed at [facility], [place] [address], [place], [place] [address]  Type and screen [place]     Status: None   Collection Time: 11/13/20 12:20 PM  Result Value Ref Range   ABO/RH(D) AB POS    Antibody Screen NEG    Sample Expiration      11/16/2020,2359 Performed at [facility], [place] [address] [place], [place] [address]   POC occult blood, ED     Status: Abnormal   Collection Time: 11/13/20 12:25 PM  Result Value Ref Range   Fecal Occult Bld POSITIVE (A) NEGATIVE  Resp Panel by RT-PCR (Flu A&B, Covid) Nasopharyngeal Swab     Status: None   Collection Time: 11/13/20  1:50 PM   Specimen: Nasopharyngeal Swab; Nasopharyngeal(NP) swabs in vial transport medium  Result Value Ref Range   SARS Coronavirus 2 by RT PCR NEGATIVE NEGATIVE    Comment: (NOTE) SARS-CoV-2 target nucleic acids are NOT DETECTED.  The SARS-CoV-2 RNA is generally detectable in upper respiratory specimens during the acute phase of infection. The  lowest concentration of SARS-CoV-2 viral copies this assay can detect is 138 copies/mL. A negative result does not preclude SARS-Cov-2 infection and should not be used as the sole basis for treatment or other patient management decisions. A negative result may occur with  improper specimen  collection/handling, submission of specimen other than nasopharyngeal swab, presence of viral mutation(s) within the areas targeted by this assay, and inadequate number of viral copies(<138 copies/mL). A negative result must be combined with clinical observations, patient history, and epidemiological information. The expected result is Negative.  Fact Sheet for Patients:  EntrepreneurPulse.com.au  Fact Sheet for Healthcare Providers:  IncredibleEmployment.be  This test is no t yet approved or cleared by the [place] FDA and  has been authorized for detection and/or diagnosis of SARS-CoV-2 by FDA under an Emergency Use Authorization (EUA). This EUA will remain  in effect (meaning this test can be used) for the duration of the COVID-19 declaration under Section 564(b)(1) of the Act, 21 U.S.C.section 360bbb-3(b)(1), unless the authorization is terminated  or revoked sooner.       Influenza A by PCR NEGATIVE NEGATIVE   Influenza B by PCR NEGATIVE NEGATIVE    Comment: (NOTE) The Xpert Xpress SARS-CoV-2/FLU/RSV plus assay is intended as an aid in the diagnosis of influenza from Nasopharyngeal swab specimens and should not be used as a sole basis for treatment. Nasal washings and aspirates are unacceptable for Xpert Xpress SARS-CoV-2/FLU/RSV testing.  Fact Sheet for Patients: EntrepreneurPulse.com.au  Fact Sheet for Healthcare Providers: IncredibleEmployment.be  This test is not yet approved or cleared by the [place] FDA and has been authorized for detection and/or diagnosis of SARS-CoV-2 by FDA under an Emergency  Use Authorization (EUA). This EUA will remain in effect (meaning this test can be used) for the duration of the COVID-19 declaration under Section 564(b)(1) of the Act, 21 U.S.C. section 360bbb-3(b)(1), unless the authorization is terminated or revoked.  Performed at [facility], [place] [address], [place], [place] [address]   CBC Once     Status: Abnormal   Collection Time: 11/13/20  4:00 PM  Result Value Ref Range   WBC 12.0 (H) 4.0 - 10.5 K/uL   RBC 3.38 (L) 3.87 - 5.11 MIL/uL   Hemoglobin 10.4 (L) 12.0 - 15.0 g/dL   HCT 33.7 (L) 36.0 - 46.0 %   MCV 99.7 80.0 - 100.0 fL   MCH 30.8 26.0 - 34.0 pg   MCHC 30.9 30.0 - 36.0 g/dL   RDW 13.2 11.5 - 15.5 %   Platelets 249 150 - 400 K/uL   nRBC 0.0 0.0 - 0.2 %    Comment: Performed at [facility], [place] [address] [place], [place] [address]  CBC Once     Status: Abnormal   Collection Time: 11/14/20 12:14 AM  Result Value Ref Range   WBC 9.6 4.0 - 10.5 K/uL   RBC 3.05 (L) 3.87 - 5.11 MIL/uL   Hemoglobin 9.2 (L) 12.0 - 15.0 g/dL   HCT 29.7 (L) 36.0 - 46.0 %   MCV 97.4 80.0 - 100.0 fL   MCH 30.2 26.0 - 34.0 pg   MCHC 31.0 30.0 - 36.0 g/dL   RDW 13.0 11.5 - 15.5 %   Platelets 206 150 - 400 K/uL   nRBC 0.0 0.0 - 0.2 %    Comment: Performed at [facility], [place] [address] [place], [place] [id number]  Basic metabolic panel     Status: Abnormal   Collection Time: 11/14/20 12:14 AM  Result Value Ref Range   Sodium 139 135 - 145 mmol/L   Potassium 4.7 3.5 - 5.1 mmol/L   Chloride 117 (H) 98 - 111 mmol/L   CO2 15 (L) 22 - 32 mmol/L   Glucose, Bld  94 70 - 99 mg/dL    Comment: Glucose reference range applies only to samples taken after fasting for at least 8 hours.   BUN 62 (H) 8 - 23 mg/dL   Creatinine, Ser 2.56 (H) 0.44 - 1.00 mg/dL   Calcium 7.7 (L) 8.9 - 10.3 mg/dL   GFR, Estimated 17 (L) >60 mL/min    Comment: (NOTE) Calculated using the CKD-EPI Creatinine Equation (2021)     Anion gap 7 5 - 15    Comment: Performed at [facility], [place] [address], [place], [place] [address]  CBC Once     Status: Abnormal   Collection Time: 11/14/20  7:49 AM  Result Value Ref Range   WBC 8.6 4.0 - 10.5 K/uL   RBC 2.97 (L) 3.87 - 5.11 MIL/uL   Hemoglobin 9.2 (L) 12.0 - 15.0 g/dL   HCT 29.6 (L) 36.0 - 46.0 %   MCV 99.7 80.0 - 100.0 fL   MCH 31.0 26.0 - 34.0 pg   MCHC 31.1 30.0 - 36.0 g/dL   RDW 13.1 11.5 - 15.5 %   Platelets 202 150 - 400 K/uL   nRBC 0.0 0.0 - 0.2 %    Comment: Performed at [facility], [place] [address], [place], [place] [address]    No results found.  Review of Systems  Constitutional: Negative for activity change, appetite change, chills, diaphoresis, fatigue, fever and unexpected weight change.  HENT: Negative.   Eyes: Negative.   Respiratory: Negative.   Cardiovascular: Negative.   Gastrointestinal: Positive for abdominal distention, anal bleeding, blood in stool and constipation. Negative for abdominal pain, diarrhea, nausea, rectal pain and vomiting.  Endocrine: Negative.   Genitourinary: Positive for decreased urine volume.  Musculoskeletal: Positive for arthralgias.  Neurological: Negative.   Hematological: Negative.   Psychiatric/Behavioral: Negative.    Blood pressure (!) 131/50, pulse 66, temperature 98 F (36.7 C), temperature source Oral, resp. rate 20, height '4\' 8"'$  (1.422 m), weight 72 kg, SpO2 99 %. Physical Exam Constitutional:      General: She is not in acute distress.    Appearance: Normal appearance. She is not toxic-appearing or diaphoretic.  HENT:     Head: Normocephalic and atraumatic.     Mouth/Throat:     Mouth: Mucous membranes are dry.  Eyes:     Extraocular Movements: Extraocular movements intact.     Pupils: Pupils are equal, round, and reactive to light.  Cardiovascular:     Rate and Rhythm: Normal rate and regular rhythm.  Pulmonary:     Effort: Pulmonary effort is normal.      Breath sounds: Normal breath sounds.  Abdominal:     General: There is no distension.     Palpations: Abdomen is soft.     Tenderness: There is no abdominal tenderness. There is no guarding.  Musculoskeletal:     Cervical back: Normal range of motion and neck supple.  Skin:    General: Skin is warm and dry.  Neurological:     Mental Status: She is alert and oriented to person, place, and time.  Psychiatric:        Mood and Affect: Mood normal.        Behavior: Behavior normal.   Assessment/Plan: 1) Rectal bleeding with posthemorrhagic anemia complicated by history of sigmoid diverticulosis and proctitis on mesalamine-plans are to reprep the patient for a colonoscopy tomorrow.  Patient's hemoglobin is dropped from 11.5 to 9.4 g/dL today.  Do the prep due to  the patient's age and limited mobility the patient's daughter has been given permission by the nurse in charge to stay in the room tonight to help her mother with the prep and encourage her to use the bedside commode to prevent any falls might further complicate the situation. 2) GERD/hiatal hernia/Barrett's esophagus-on PPI's. 3) Acute on chronic kidney disease stage V GFR less than 15. 4) Hyperlipidemia/Hypertension/morbid obesity.   Juanita Craver 11/14/2020, 12:19 PM

## 2020-11-14 NOTE — Progress Notes (Signed)
Patient had large bloody BM. On-call X.Blount NP notified. VS are stable. CBC is pending at this time. Will continue to monitor the patient.

## 2020-11-14 NOTE — Progress Notes (Signed)
[facility]                                                                              Patient Demographics  Brandy Fuentes, is a 87 y.o. female, DOB - 1929-01-28, [id number]  Admit date - 11/13/2020   Admitting Physician Harold Hedge, MD  Outpatient Primary MD for the patient is Corrington, Delsa Grana, MD  Outpatient specialists:   LOS - 0  days   Medical records reviewed and are as summarized below:    Chief Complaint  Patient presents with  . Rectal Bleeding       Brief summary   Patient is a 87 year old female with CKD stage V not on dialysis, history of diverticulosis, diverticulitis, IBD, Barrett's esophagus, chronic gastritis, hiatal hernia, hypertension presented to ED with 1 day of bright and dark red painless rectal bleeding.  Patient also reported that she had occasional bleeding over the past several weeks. Over the past day before admission, became more constant and severe. In ED, FOBT positive, creatinine 2.9, hemoglobin 11.5  Assessment & Plan    Principal Problem:   Acute blood loss anemia, secondary to GI bleed, history of diverticulosis -Overnight and this morning had rectal bleeding, this morning, hemoglobin down to 9.2, 11.5 on admission -Tagged RBC scan ordered, still pending -GI consulted, will be evaluated by Dr. Benson Norway -Placed on clear liquid diet, currently n.p.o. for tagged RBC scan -Also on mesalamine outpatient, currently on hold  Active Problems:    Barrett's esophagus, history of chronic gastritis and hiatal hernia -Continue PPI    Hypertension -Olmesartan currently on hold, will place on IV hydralazine as needed with parameters  Acute on CKD (chronic kidney disease) stage 5, GFR less than 15 ml/min (HCC) -Baseline creatinine 2.3-2.7 -Presented with creatinine of 2.9 on admission, improving to 2.5    Hyperlipidemia -Not on statins outpatient    Obesity (BMI 30-39.9) Estimated body mass index is 35.59 kg/m as  calculated from the following:   Height as of this encounter: '4\' 8"'$  (1.422 m).   Weight as of this encounter: 72 kg.  Code Status: *Full CODE STATUS DVT Prophylaxis:  SCDs Start: 11/13/20 1537   Level of Care: Level of care: Telemetry Family Communication: Discussed all imaging results, lab results, explained to the patient's son on the phone, Sierre Vanderkamp (phone number [phone])   Disposition Plan:     Status is: Observation  The patient remains OBS appropriate and will d/c before 2 midnights.  Dispo: The patient is from: Home              Anticipated d/c is to: Home              Patient currently is not medically stable to d/c.  Still actively bleeding, awaiting tagged RBC scan and further GI evaluation   Difficult to place patient No      Time Spent in minutes 35 minutes  Procedures:  None  Consultants:   Gastroenterology  Antimicrobials:   Anti-infectives (From admission, onward)   None         Medications  Scheduled Meds: . pantoprazole (PROTONIX) IV  40  mg Intravenous Q24H  . sodium chloride flush  3 mL Intravenous Q12H   Continuous Infusions: PRN Meds:.metoprolol tartrate      Subjective:   Carin Askeland was seen and examined today.  No nausea vomiting or abdominal pain.  Patient reported that she had bleeding last night and this morning when she went on the commode.  Noticed fresh blood with specks of dark blood in the toilet. Patient denies dizziness, chest pain, shortness of breath.  No fevers Objective:   Vitals:   11/13/20 1849 11/13/20 2308 11/14/20 0153 11/14/20 0634  BP: (!) 138/92 (!) 145/61 (!) 153/70 (!) 131/50  Pulse: 65 63 81 66  Resp: 15 (!) '24 16 20  '$ Temp: 98.1 F (36.7 C) 98.1 F (36.7 C) 97.9 F (36.6 C) 98 F (36.7 C)  TempSrc:  Oral Oral Oral  SpO2: 100% 98% 100% 99%  Weight:      Height:        Intake/Output Summary (Last 24 hours) at 11/14/2020 1015 Last data filed at 11/14/2020 0400 Gross per 24 hour  Intake  888.49 ml  Output --  Net 888.49 ml     Wt Readings from Last 3 Encounters:  11/13/20 72 kg  02/26/20 72.1 kg  10/25/16 68.5 kg     Exam  General: Alert and oriented x 3, NAD  Cardiovascular: S1 S2 auscultated, no murmurs, RRR  Respiratory: Clear to auscultation bilaterally, no wheezing, rales or rhonchi  Gastrointestinal: Soft, nontender, nondistended, + bowel sounds  Ext: no pedal edema bilaterally  Neuro: no new deficits  Musculoskeletal: No digital cyanosis, clubbing  Skin: No rashes  Psych: Normal affect and demeanor, alert and oriented x3    Data Reviewed:  I have personally reviewed following labs and imaging studies  Micro Results Recent Results (from the past 240 hour(s))  Resp Panel by RT-PCR (Flu A&B, Covid) Nasopharyngeal Swab     Status: None   Collection Time: 11/13/20  1:50 PM   Specimen: Nasopharyngeal Swab; Nasopharyngeal(NP) swabs in vial transport medium  Result Value Ref Range Status   SARS Coronavirus 2 by RT PCR NEGATIVE NEGATIVE Final    Comment: (NOTE) SARS-CoV-2 target nucleic acids are NOT DETECTED.  The SARS-CoV-2 RNA is generally detectable in upper respiratory specimens during the acute phase of infection. The lowest concentration of SARS-CoV-2 viral copies this assay can detect is 138 copies/mL. A negative result does not preclude SARS-Cov-2 infection and should not be used as the sole basis for treatment or other patient management decisions. A negative result may occur with  improper specimen collection/handling, submission of specimen other than nasopharyngeal swab, presence of viral mutation(s) within the areas targeted by this assay, and inadequate number of viral copies(<138 copies/mL). A negative result must be combined with clinical observations, patient history, and epidemiological information. The expected result is Negative.  Fact Sheet for Patients:  EntrepreneurPulse.com.au  Fact Sheet for  Healthcare Providers:  IncredibleEmployment.be  This test is no t yet approved or cleared by the [place] FDA and  has been authorized for detection and/or diagnosis of SARS-CoV-2 by FDA under an Emergency Use Authorization (EUA). This EUA will remain  in effect (meaning this test can be used) for the duration of the COVID-19 declaration under Section 564(b)(1) of the Act, 21 U.S.C.section 360bbb-3(b)(1), unless the authorization is terminated  or revoked sooner.       Influenza A by PCR NEGATIVE NEGATIVE Final   Influenza B by PCR NEGATIVE NEGATIVE Final    Comment: (  NOTE) The Xpert Xpress SARS-CoV-2/FLU/RSV plus assay is intended as an aid in the diagnosis of influenza from Nasopharyngeal swab specimens and should not be used as a sole basis for treatment. Nasal washings and aspirates are unacceptable for Xpert Xpress SARS-CoV-2/FLU/RSV testing.  Fact Sheet for Patients: EntrepreneurPulse.com.au  Fact Sheet for Healthcare Providers: IncredibleEmployment.be  This test is not yet approved or cleared by the [place] FDA and has been authorized for detection and/or diagnosis of SARS-CoV-2 by FDA under an Emergency Use Authorization (EUA). This EUA will remain in effect (meaning this test can be used) for the duration of the COVID-19 declaration under Section 564(b)(1) of the Act, 21 U.S.C. section 360bbb-3(b)(1), unless the authorization is terminated or revoked.  Performed at [facility], [place] [address] [place], [place] [address]     Radiology Reports No results found.  Lab Data:  CBC: Recent Labs  Lab 11/13/20 1212 11/13/20 1600 11/14/20 0014 11/14/20 0749  WBC 10.4 12.0* 9.6 8.6  NEUTROABS 8.2*  --   --   --   HGB 11.5* 10.4* 9.2* 9.2*  HCT 35.9* 33.7* 29.7* 29.6*  MCV 99.2 99.7 97.4 99.7  PLT 252 249 206 [id number]   Basic Metabolic Panel: Recent Labs  Lab 11/13/20 1212  11/14/20 0014  NA 139 139  K 4.5 4.7  CL 112* 117*  CO2 17* 15*  GLUCOSE 98 94  BUN 73* 62*  CREATININE 2.99* 2.56*  CALCIUM 8.1* 7.7*   GFR: Estimated Creatinine Clearance: 11.7 mL/min (A) (by C-G formula based on SCr of 2.56 mg/dL (H)). Liver Function Tests: Recent Labs  Lab 11/13/20 1212  AST 11*  ALT 13  ALKPHOS 50  BILITOT 0.6  PROT 6.5  ALBUMIN 3.4*   Recent Labs  Lab 11/13/20 1212  LIPASE 63*   No results for input(s): AMMONIA in the last 168 hours. Coagulation Profile: No results for input(s): INR, PROTIME in the last 168 hours. Cardiac Enzymes: No results for input(s): CKTOTAL, CKMB, CKMBINDEX, TROPONINI in the last 168 hours. BNP (last 3 results) No results for input(s): PROBNP in the last 8760 hours. HbA1C: No results for input(s): HGBA1C in the last 72 hours. CBG: No results for input(s): GLUCAP in the last 168 hours. Lipid Profile: No results for input(s): CHOL, HDL, LDLCALC, TRIG, CHOLHDL, LDLDIRECT in the last 72 hours. Thyroid Function Tests: No results for input(s): TSH, T4TOTAL, FREET4, T3FREE, THYROIDAB in the last 72 hours. Anemia Panel: No results for input(s): VITAMINB12, FOLATE, FERRITIN, TIBC, IRON, RETICCTPCT in the last 72 hours. Urine analysis: No results found for: COLORURINE, APPEARANCEUR, LABSPEC, PHURINE, GLUCOSEU, HGBUR, BILIRUBINUR, KETONESUR, PROTEINUR, UROBILINOGEN, NITRITE, LEUKOCYTESUR   Ndeye Tenorio M.D. [facility] 11/14/2020, 10:15 AM  Available via Epic secure chat 7am-7pm After 7 pm, please refer to night coverage provider listed on amion.

## 2020-11-15 ENCOUNTER — Encounter (HOSPITAL_COMMUNITY)
Admission: EM | Disposition: A | Discharge status: Home / Self Care | Payer: Self-pay | Source: Home / Self Care | Attending: Internal Medicine

## 2020-11-15 ENCOUNTER — Encounter (HOSPITAL_COMMUNITY): Payer: Self-pay | Admitting: Internal Medicine

## 2020-11-15 DIAGNOSIS — Z881 Allergy status to other antibiotic agents status: Secondary | ICD-10-CM | POA: Diagnosis not present

## 2020-11-15 DIAGNOSIS — Z8371 Family history of colonic polyps: Secondary | ICD-10-CM | POA: Diagnosis not present

## 2020-11-15 DIAGNOSIS — D62 Acute posthemorrhagic anemia: Secondary | ICD-10-CM | POA: Diagnosis present

## 2020-11-15 DIAGNOSIS — Z8249 Family history of ischemic heart disease and other diseases of the circulatory system: Secondary | ICD-10-CM | POA: Diagnosis not present

## 2020-11-15 DIAGNOSIS — K922 Gastrointestinal hemorrhage, unspecified: Secondary | ICD-10-CM | POA: Diagnosis present

## 2020-11-15 DIAGNOSIS — I120 Hypertensive chronic kidney disease with stage 5 chronic kidney disease or end stage renal disease: Secondary | ICD-10-CM | POA: Diagnosis present

## 2020-11-15 DIAGNOSIS — E875 Hyperkalemia: Secondary | ICD-10-CM | POA: Diagnosis present

## 2020-11-15 DIAGNOSIS — Z20822 Contact with and (suspected) exposure to covid-19: Secondary | ICD-10-CM | POA: Diagnosis present

## 2020-11-15 DIAGNOSIS — K5732 Diverticulitis of large intestine without perforation or abscess without bleeding: Secondary | ICD-10-CM | POA: Diagnosis not present

## 2020-11-15 DIAGNOSIS — Z6835 Body mass index (BMI) 35.0-35.9, adult: Secondary | ICD-10-CM | POA: Diagnosis not present

## 2020-11-15 DIAGNOSIS — K2270 Barrett's esophagus without dysplasia: Secondary | ICD-10-CM | POA: Diagnosis present

## 2020-11-15 DIAGNOSIS — K219 Gastro-esophageal reflux disease without esophagitis: Secondary | ICD-10-CM | POA: Diagnosis present

## 2020-11-15 DIAGNOSIS — N185 Chronic kidney disease, stage 5: Secondary | ICD-10-CM | POA: Diagnosis present

## 2020-11-15 DIAGNOSIS — E785 Hyperlipidemia, unspecified: Secondary | ICD-10-CM | POA: Diagnosis present

## 2020-11-15 DIAGNOSIS — N179 Acute kidney failure, unspecified: Secondary | ICD-10-CM | POA: Diagnosis present

## 2020-11-15 DIAGNOSIS — K2950 Unspecified chronic gastritis without bleeding: Secondary | ICD-10-CM | POA: Diagnosis present

## 2020-11-15 DIAGNOSIS — K449 Diaphragmatic hernia without obstruction or gangrene: Secondary | ICD-10-CM | POA: Diagnosis present

## 2020-11-15 DIAGNOSIS — K5733 Diverticulitis of large intestine without perforation or abscess with bleeding: Secondary | ICD-10-CM | POA: Diagnosis present

## 2020-11-15 DIAGNOSIS — Z79899 Other long term (current) drug therapy: Secondary | ICD-10-CM | POA: Diagnosis not present

## 2020-11-15 HISTORY — PX: COLONOSCOPY: SHX5424

## 2020-11-15 HISTORY — PX: BIOPSY: SHX5522

## 2020-11-15 LAB — BASIC METABOLIC PANEL WITH GFR
Anion gap: 6 (ref 5–15)
BUN: 56 mg/dL — ABNORMAL HIGH (ref 8–23)
CO2: 18 mmol/L — ABNORMAL LOW (ref 22–32)
Calcium: 8.1 mg/dL — ABNORMAL LOW (ref 8.9–10.3)
Chloride: 115 mmol/L — ABNORMAL HIGH (ref 98–111)
Creatinine, Ser: 2.74 mg/dL — ABNORMAL HIGH (ref 0.44–1.00)
GFR, Estimated: 16 mL/min — ABNORMAL LOW (ref >60)
Glucose, Bld: 88 mg/dL (ref 70–99)
Potassium: 5.5 mmol/L — ABNORMAL HIGH (ref 3.5–5.1)
Sodium: 139 mmol/L (ref 135–145)

## 2020-11-15 LAB — CBC
HCT: 29.3 % — ABNORMAL LOW (ref 36.0–46.0)
Hemoglobin: 9.2 g/dL — ABNORMAL LOW (ref 12.0–15.0)
MCH: 30.5 pg (ref 26.0–34.0)
MCHC: 31.4 g/dL (ref 30.0–36.0)
MCV: 97 fL (ref 80.0–100.0)
Platelets: DECREASED K/uL (ref 150–400)
RBC: 3.02 MIL/uL — ABNORMAL LOW (ref 3.87–5.11)
RDW: 13.1 % (ref 11.5–15.5)
WBC: 12.5 K/uL — ABNORMAL HIGH (ref 4.0–10.5)
nRBC: 0 % (ref 0.0–0.2)

## 2020-11-15 LAB — POTASSIUM: Potassium: 4.5 mmol/L (ref 3.5–5.1)

## 2020-11-15 SURGERY — COLONOSCOPY
Anesthesia: Moderate Sedation

## 2020-11-15 MED ORDER — FENTANYL CITRATE (PF) 100 MCG/2ML IJ SOLN
INTRAMUSCULAR | Status: DC | PRN
  Administered 2020-11-15 (×3): 25 ug via INTRAVENOUS

## 2020-11-15 MED ORDER — LACTATED RINGERS IV SOLN: INTRAVENOUS | Status: DC

## 2020-11-15 MED ORDER — FENTANYL CITRATE (PF) 100 MCG/2ML IJ SOLN
INTRAMUSCULAR | Status: AC
  Filled 2020-11-15: qty 4

## 2020-11-15 MED ORDER — MIDAZOLAM HCL (PF) 5 MG/ML IJ SOLN
INTRAMUSCULAR | Status: AC
  Filled 2020-11-15: qty 2

## 2020-11-15 MED ORDER — LACTATED RINGERS IV SOLN: INTRAVENOUS | Status: AC

## 2020-11-15 MED ORDER — SODIUM ZIRCONIUM CYCLOSILICATE 10 G PO PACK
10.0000 g | PACK | Freq: Once | ORAL | Status: DC
  Filled 2020-11-15: qty 1

## 2020-11-15 MED ORDER — MIDAZOLAM HCL 5 MG/5ML IJ SOLN
INTRAMUSCULAR | Status: DC | PRN
  Administered 2020-11-15 (×3): 2 mg via INTRAVENOUS

## 2020-11-15 MED ORDER — DIPHENHYDRAMINE HCL 50 MG/ML IJ SOLN
INTRAMUSCULAR | Status: AC
  Filled 2020-11-15: qty 1

## 2020-11-15 SURGICAL SUPPLY — 22 items

## 2020-11-15 NOTE — Progress Notes (Signed)
Tolerated 3/4 of prep running liquid brown

## 2020-11-15 NOTE — Op Note (Signed)
[facility] Patient Name: Brandy Fuentes Procedure Date: 11/15/2020 MRN: [id number] Attending MD: Carol Ada , MD Date of Birth: 11/09/1928 CSN: [id number] Age: 87 Admit Type: Inpatient Procedure:                Colonoscopy Indications:              Hematochezia Providers:                Carol Ada, MD, Particia Nearing, RN, Benetta Spar, Technician Referring MD:              Medicines:                Fentanyl 75 micrograms IV, Midazolam 6 mg IV Complications:            No immediate complications. Estimated Blood Loss:     Estimated blood loss was minimal. Procedure:                Pre-Anesthesia Assessment:                           - Prior to the procedure, a History and Physical                            was performed, and patient medications and                            allergies were reviewed. The patient's tolerance of                            previous anesthesia was also reviewed. The risks                            and benefits of the procedure and the sedation                            options and risks were discussed with the patient.                            All questions were answered, and informed consent                            was obtained. Prior Anticoagulants: The patient has                            taken no previous anticoagulant or antiplatelet                            agents. ASA Grade Assessment: III - A patient with                            severe systemic disease. After reviewing the risks  and benefits, the patient was deemed in                            satisfactory condition to undergo the procedure.                           - Sedation was administered by an endoscopy nurse.                            Deep sedation was attained.                           After obtaining informed consent, the colonoscope                            was passed under direct vision.  Throughout the                            procedure, the patient's blood pressure, pulse, and                            oxygen saturations were monitored continuously. The                            [id number]) Olympus pediatric colonscope                            was introduced through the anus and advanced to the                            the cecum, identified by appendiceal orifice and                            ileocecal valve. The colonoscopy was performed                            without difficulty. The patient tolerated the                            procedure well. The quality of the bowel                            preparation was good. The ileocecal valve,                            appendiceal orifice, and rectum were photographed. Scope In: 4:12:27 PM Scope Out: 4:31:35 PM Scope Withdrawal Time: 0 hours 13 minutes 39 seconds  Total Procedure Duration: 0 hours 19 minutes 8 seconds  Findings:      A localized area of granular mucosa was found in the rectum. Biopsies       were taken with a cold forceps for histology.      Scattered large-mouthed diverticula were found in the sigmoid colon.      The bleeding most likely is from the distal rectum/dental line region.  There was evidence of mucosal irregularity in the form of granularity.       No friability was noted. She also did not have any evidence of       proctitis, which was evident with her prior and recent colonoscopy.       There was no evidence of any stercoral ulceration, but the mucosal       changes appear to be similar to that type of finding. Cold biopsies were       obtained. There was no evidence of any blood with the prep or blood with       any stool balls. Blood in the stool balls is typically seen with a       diverticular bleed. Impression:               - Granularity in the rectum. Biopsied.                           - Diverticulosis in the sigmoid colon. Moderate Sedation:       Moderate (conscious) sedation was administered by the endoscopy nurse       and supervised by the endoscopist. The patient's oxygen saturation,       heart rate, blood pressure and response to care were monitored. Recommendation:           - Return patient to hospital ward for ongoing care.                           - Resume regular diet.                           - Continue present medications.                           - Await pathology results.                           - Return to GI clinic in 2 weeks.                           - If her HGB remains stable she can be discharged                            home tomorrow. Procedure Code(s):        --- Professional ---                           [phone], Colonoscopy, flexible; with biopsy, single                            or multiple Diagnosis Code(s):        --- Professional ---                           K62.89, Other specified diseases of anus and rectum                           K92.1, Melena (includes Hematochezia)  K57.30, Diverticulosis of large intestine without                            perforation or abscess without bleeding CPT copyright 2019 American Medical Association. All rights reserved. The codes documented in this report are preliminary and upon coder review may  be revised to meet current compliance requirements. Carol Ada, MD Carol Ada, MD 11/15/2020 4:41:44 PM This report has been signed electronically. Number of Addenda: 0

## 2020-11-15 NOTE — Progress Notes (Addendum)
[facility]                                                                              Patient Demographics  Deari Wemple, is a 87 y.o. female, DOB - 19-Dec-1928, [id number]  Admit date - 11/13/2020   Admitting Physician Harold Hedge, MD  Outpatient Primary MD for the patient is Corrington, Kip A, MD  Outpatient specialists:   LOS - 0  days   Medical records reviewed and are as summarized below:    Chief Complaint  Patient presents with   Rectal Bleeding       Brief summary   Patient is a 87 year old female with CKD stage V not on dialysis, history of diverticulosis, diverticulitis, IBD, Barrett's esophagus, chronic gastritis, hiatal hernia, hypertension presented to ED with 1 day of bright and dark red painless rectal bleeding.  Patient also reported that she had occasional bleeding over the past several weeks. Over the past day before admission, became more constant and severe. In ED, FOBT positive, creatinine 2.9, hemoglobin 11.5  3/14: Admitted with rectal bleeding likely diverticular, RBC scan negative.  GI consulted 3/15: Plan for colonoscopy today  Assessment & Plan    Principal Problem:   Acute blood loss anemia, secondary to GI bleed, history of diverticulosis -Still had bleeding yesterday, now clearing up.  Hemoglobin down from 11.5 on admission to 9.2 -CAD RBC scan negative.  GI consulted -Plan for colonoscopy today -Mesalamine currently on hold  Active Problems:    Barrett's esophagus, history of chronic gastritis and hiatal hernia -Continue PPI    Hypertension -BP currently soft, placed on gentle hydration while awaiting colonoscopy and back on p.o.  -Olmesartan currently on hold, continue IV hydralazine as needed with parameters   Acute on CKD (chronic kidney disease) stage 5, GFR less than 15 ml/min (HCC) -Baseline creatinine 2.3-2.7 -Presented with creatinine of 2.9 on admission, creatinine 2.7 today, placed on gentle  hydration while awaiting colonoscopy   Hyperkalemia Potassium 5.5 this morning, lab repeated Repeat potassium improved to 4.5    Hyperlipidemia -Not on statins outpatient    Obesity (BMI 30-39.9) Estimated body mass index is 35.59 kg/m as calculated from the following:   Height as of this encounter: '4\' 8"'$  (1.422 m).   Weight as of this encounter: 72 kg.  Code Status: *Full CODE STATUS DVT Prophylaxis:  SCDs Start: 11/13/20 1537   Level of Care: Level of care: Telemetry Family Communication: Discussed all imaging results, lab results, explained to the patient's son on the phone, Asata Migliore (phone number [phone]) on 3/14.  Discussed with daughter at the bedside today   Disposition Plan:     Status is: Inpatient  Dispo: The patient is from: Home              Anticipated d/c is to: Home              Patient currently is not medically stable to d/c.  Plan for colonoscopy today   difficult to place patient No      Time Spent in minutes 25 minutes  Procedures:  None  Consultants:   Gastroenterology  Antimicrobials:   Anti-infectives (From admission, onward)   None         Medications  Scheduled Meds:  pantoprazole (PROTONIX) IV  40 mg Intravenous Q24H   sodium chloride flush  3 mL Intravenous Q12H   sodium zirconium cyclosilicate  10 g Oral Once   Continuous Infusions:  sodium chloride     lactated ringers 75 mL/hr at 11/15/20 0638   PRN Meds:.metoprolol tartrate      Subjective:   Jeannee Danis was seen and examined today.  Per daughter, patient had bleeding yesterday evening when colon prep was started, now clearing up.  No abdominal pain, nausea or vomiting.  Patient denied any dizziness, lightheadedness, chest pain or shortness of breath.  Daughter at the bedside.  Objective:   Vitals:   11/14/20 0634 11/14/20 1254 11/14/20 2100 11/15/20 0624  BP: (!) 131/50 (!) 144/61 (!) 155/62 (!) 107/37  Pulse: 66 67 67 67  Resp: 20 16 (!) 21  (!) 21  Temp: 98 F (36.7 C) 98.2 F (36.8 C) 97.7 F (36.5 C) 98.1 F (36.7 C)  TempSrc: Oral Oral Oral Oral  SpO2: 99% 100% 100% 98%  Weight:      Height:        Intake/Output Summary (Last 24 hours) at 11/15/2020 1351 Last data filed at 11/14/2020 2200 Gross per 24 hour  Intake --  Output 550 ml  Net -550 ml     Wt Readings from Last 3 Encounters:  11/13/20 72 kg  02/26/20 72.1 kg  10/25/16 68.5 kg   Physical Exam  General: Alert and oriented x 3, NAD  Cardiovascular: S1 S2 clear, RRR. No pedal edema b/l  Respiratory: CTA B  Gastrointestinal: Soft, NT, ND, nondistended, NBS  Ext: no pedal edema bilaterally  Neuro: no new deficits  Musculoskeletal: No cyanosis, clubbing  Skin: No rashes  Psych: Normal affect and demeanor, alert and oriented x3   Data Reviewed:  I have personally reviewed following labs and imaging studies  Micro Results Recent Results (from the past 240 hour(s))  Resp Panel by RT-PCR (Flu A&B, Covid) Nasopharyngeal Swab     Status: None   Collection Time: 11/13/20  1:50 PM   Specimen: Nasopharyngeal Swab; Nasopharyngeal(NP) swabs in vial transport medium  Result Value Ref Range Status   SARS Coronavirus 2 by RT PCR NEGATIVE NEGATIVE Final    Comment: (NOTE) SARS-CoV-2 target nucleic acids are NOT DETECTED.  The SARS-CoV-2 RNA is generally detectable in upper respiratory specimens during the acute phase of infection. The lowest concentration of SARS-CoV-2 viral copies this assay can detect is 138 copies/mL. A negative result does not preclude SARS-Cov-2 infection and should not be used as the sole basis for treatment or other patient management decisions. A negative result may occur with  improper specimen collection/handling, submission of specimen other than nasopharyngeal swab, presence of viral mutation(s) within the areas targeted by this assay, and inadequate number of viral copies(<138 copies/mL). A negative result must be  combined with clinical observations, patient history, and epidemiological information. The expected result is Negative.  Fact Sheet for Patients:  EntrepreneurPulse.com.au  Fact Sheet for Healthcare Providers:  IncredibleEmployment.be  This test is no t yet approved or cleared by the [place] FDA and  has been authorized for detection and/or diagnosis of SARS-CoV-2 by FDA under an Emergency Use Authorization (EUA). This EUA will remain  in effect (meaning this test can be used) for the duration of the COVID-19 declaration under Section 564(b)(1) of the  Act, 21 U.S.C.section 360bbb-3(b)(1), unless the authorization is terminated  or revoked sooner.       Influenza A by PCR NEGATIVE NEGATIVE Final   Influenza B by PCR NEGATIVE NEGATIVE Final    Comment: (NOTE) The Xpert Xpress SARS-CoV-2/FLU/RSV plus assay is intended as an aid in the diagnosis of influenza from Nasopharyngeal swab specimens and should not be used as a sole basis for treatment. Nasal washings and aspirates are unacceptable for Xpert Xpress SARS-CoV-2/FLU/RSV testing.  Fact Sheet for Patients: EntrepreneurPulse.com.au  Fact Sheet for Healthcare Providers: IncredibleEmployment.be  This test is not yet approved or cleared by the [place] FDA and has been authorized for detection and/or diagnosis of SARS-CoV-2 by FDA under an Emergency Use Authorization (EUA). This EUA will remain in effect (meaning this test can be used) for the duration of the COVID-19 declaration under Section 564(b)(1) of the Act, 21 U.S.C. section 360bbb-3(b)(1), unless the authorization is terminated or revoked.  Performed at [facility], [place] [address], [place], [place] [address]     Radiology Reports NM GI Blood Loss  Result Date: 11/14/2020 CLINICAL DATA:  Decreased hemoglobin with lower gastrointestinal bleeding EXAM: NUCLEAR  MEDICINE GASTROINTESTINAL BLEEDING SCAN TECHNIQUE: Sequential abdominal images were obtained following intravenous administration of Tc-62mlabeled red blood cells. RADIOPHARMACEUTICALS:  24.1 mCi Tc-946mertechnetate in-vitro labeled red cells. COMPARISON:  None. FINDINGS: Images of the anterior abdomen and pelvis were obtained serially over a 2 hour time span. There is no abnormal uptake of radiotracer to indicate a site of active gastrointestinal bleeding. IMPRESSION: No site of active gastrointestinal bleeding evident on this study. No abnormality appreciable. Electronically Signed   By: WiLowella GripII M.D.   On: 11/14/2020 13:31    Lab Data:  CBC: Recent Labs  Lab 11/13/20 1212 11/13/20 1600 11/14/20 0014 11/14/20 0749 11/14/20 1549 11/15/20 0355  WBC 10.4 12.0* 9.6 8.6 9.0 12.5*  NEUTROABS 8.2*  --   --   --   --   --   HGB 11.5* 10.4* 9.2* 9.2* 9.4* 9.2*  HCT 35.9* 33.7* 29.7* 29.6* 30.6* 29.3*  MCV 99.2 99.7 97.4 99.7 98.7 97.0  PLT 252 249 206 202 225 PLATELET CLUMPS NOTED ON SMEAR, COUNT APPEARS DECREASED   Basic Metabolic Panel: Recent Labs  Lab 11/13/20 1212 11/14/20 0014 11/15/20 0355 11/15/20 0945  NA 139 139 139  --   K 4.5 4.7 5.5* 4.5  CL 112* 117* 115*  --   CO2 17* 15* 18*  --   GLUCOSE 98 94 88  --   BUN 73* 62* 56*  --   CREATININE 2.99* 2.56* 2.74*  --   CALCIUM 8.1* 7.7* 8.1*  --    GFR: Estimated Creatinine Clearance: 10.9 mL/min (A) (by C-G formula based on SCr of 2.74 mg/dL (H)). Liver Function Tests: Recent Labs  Lab 11/13/20 1212  AST 11*  ALT 13  ALKPHOS 50  BILITOT 0.6  PROT 6.5  ALBUMIN 3.4*   Recent Labs  Lab 11/13/20 1212  LIPASE 63*   No results for input(s): AMMONIA in the last 168 hours. Coagulation Profile: No results for input(s): INR, PROTIME in the last 168 hours. Cardiac Enzymes: No results for input(s): CKTOTAL, CKMB, CKMBINDEX, TROPONINI in the last 168 hours. BNP (last 3 results) No results for input(s):  PROBNP in the last 8760 hours. HbA1C: No results for input(s): HGBA1C in the last 72 hours. CBG: No results for input(s): GLUCAP in the last 168 hours. Lipid Profile: No  results for input(s): CHOL, HDL, LDLCALC, TRIG, CHOLHDL, LDLDIRECT in the last 72 hours. Thyroid Function Tests: No results for input(s): TSH, T4TOTAL, FREET4, T3FREE, THYROIDAB in the last 72 hours. Anemia Panel: No results for input(s): VITAMINB12, FOLATE, FERRITIN, TIBC, IRON, RETICCTPCT in the last 72 hours. Urine analysis: No results found for: COLORURINE, APPEARANCEUR, LABSPEC, PHURINE, GLUCOSEU, HGBUR, BILIRUBINUR, KETONESUR, PROTEINUR, UROBILINOGEN, NITRITE, LEUKOCYTESUR   Joye Wesenberg M.D. [facility] 11/15/2020, 1:51 PM  Available via Epic secure chat 7am-7pm After 7 pm, please refer to night coverage provider listed on amion.

## 2020-11-16 LAB — BASIC METABOLIC PANEL WITH GFR
Anion gap: 6 (ref 5–15)
BUN: 51 mg/dL — ABNORMAL HIGH (ref 8–23)
CO2: 17 mmol/L — ABNORMAL LOW (ref 22–32)
Calcium: 8 mg/dL — ABNORMAL LOW (ref 8.9–10.3)
Chloride: 116 mmol/L — ABNORMAL HIGH (ref 98–111)
Creatinine, Ser: 2.54 mg/dL — ABNORMAL HIGH (ref 0.44–1.00)
GFR, Estimated: 17 mL/min — ABNORMAL LOW (ref >60)
Glucose, Bld: 119 mg/dL — ABNORMAL HIGH (ref 70–99)
Potassium: 4.6 mmol/L (ref 3.5–5.1)
Sodium: 139 mmol/L (ref 135–145)

## 2020-11-16 LAB — CBC
HCT: 25.8 % — ABNORMAL LOW (ref 36.0–46.0)
Hemoglobin: 8.1 g/dL — ABNORMAL LOW (ref 12.0–15.0)
MCH: 31 pg (ref 26.0–34.0)
MCHC: 31.4 g/dL (ref 30.0–36.0)
MCV: 98.9 fL (ref 80.0–100.0)
Platelets: 165 K/uL (ref 150–400)
RBC: 2.61 MIL/uL — ABNORMAL LOW (ref 3.87–5.11)
RDW: 13.1 % (ref 11.5–15.5)
WBC: 8.4 K/uL (ref 4.0–10.5)
nRBC: 0 % (ref 0.0–0.2)

## 2020-11-16 MED ORDER — PANTOPRAZOLE SODIUM 40 MG PO TBEC: 40.0000 mg | DELAYED_RELEASE_TABLET | Freq: Every day | ORAL | Status: DC

## 2020-11-16 NOTE — Progress Notes (Signed)
[facility]  PROGRESS NOTE  Brandy Fuentes [id number] DOB: 03-05-1929 DOA: 11/13/2020 PCP: Curly Rim, MD   Brief HPI:   87 year old female with CKD stage V not on dialysis, history of diverticulosis, diverticulitis, IBD, Barrett's esophagus, chronic gastritis, hiatal hernia, hypertension presented to ED with 1 day history of bright and dark red painless rectal bleeding.  In the ED FOBT was positive, creatinine 2.9, hemoglobin 11.5. Patient was seen by GI, underwent colonoscopy, which showed diverticulosis in the sigmoid colon.    Subjective   Patient seen and examined, had minimal bleeding last night.  Biopsies obtained during colonoscopy.   Assessment/Plan:     1. Lower GI bleed-secondary to diverticulosis seen on colonoscopy.  Very minimal bleed overnight.  Discussed with Dr. Benson Norway, he recommends observing patient 1 more night to check for bleeding. 2. Acute blood loss anemia-hemoglobin dropped to 8.1 this morning from 9.2 yesterday.  Secondary to lower GI bleed as above.  Follow CBC in a.m.  If hemoglobin is stable , likely discharge home in a.m. 3. Barrett's esophagus-history of chronic gastritis, hiatal hernia.  Continue Protonix. 4. Hypertension-blood pressure is stable.  Will restart olmesartan at discharge. 5. Acute kidney injury on CKD stage V-baseline creatinine 2.3-2.7, she presented with creatinine of 2.9 on admission.  Creatinine is 2.4 today, likely at baseline 6. Hyperkalemia-resolved 7. Obesity-BMI 35.59 kg/m      COVID-19 Labs  No results for input(s): DDIMER, FERRITIN, LDH, CRP in the last 72 hours.  Lab Results  Component Value Date   [place] NEGATIVE 11/13/2020     Scheduled medications:   . [START ON 11/17/2020] pantoprazole  40 mg Oral Q1200  . sodium chloride flush  3 mL Intravenous Q12H         CBG: No results for input(s): GLUCAP in the last 168 hours.  SpO2: 97 % O2 Flow Rate (L/min): 3.5 L/min    CBC: Recent  Labs  Lab 11/13/20 1212 11/13/20 1600 11/14/20 0014 11/14/20 0749 11/14/20 1549 11/15/20 0355 11/16/20 0356  WBC 10.4   < > 9.6 8.6 9.0 12.5* 8.4  NEUTROABS 8.2*  --   --   --   --   --   --   HGB 11.5*   < > 9.2* 9.2* 9.4* 9.2* 8.1*  HCT 35.9*   < > 29.7* 29.6* 30.6* 29.3* 25.8*  MCV 99.2   < > 97.4 99.7 98.7 97.0 98.9  PLT 252   < > 206 202 225 PLATELET CLUMPS NOTED ON SMEAR, COUNT APPEARS DECREASED 165   < > = values in this interval not displayed.    Basic Metabolic Panel: Recent Labs  Lab 11/13/20 1212 11/14/20 0014 11/15/20 0355 11/15/20 0945 11/16/20 0356  NA 139 139 139  --  139  K 4.5 4.7 5.5* 4.5 4.6  CL 112* 117* 115*  --  116*  CO2 17* 15* 18*  --  17*  GLUCOSE 98 94 88  --  119*  BUN 73* 62* 56*  --  51*  CREATININE 2.99* 2.56* 2.74*  --  2.54*  CALCIUM 8.1* 7.7* 8.1*  --  8.0*     Liver Function Tests: Recent Labs  Lab 11/13/20 1212  AST 11*  ALT 13  ALKPHOS 50  BILITOT 0.6  PROT 6.5  ALBUMIN 3.4*     Antibiotics: Anti-infectives (From admission, onward)   None       DVT prophylaxis: SCDs  Code Status: Full code  Family Communication: Discussed with patient's son  at bedside   Consultants:  Gastroenterology  Procedures:  Colonoscopy    Objective   Vitals:   11/15/20 1718 11/15/20 2050 11/16/20 0509 11/16/20 1339  BP: (!) 150/74 117/60 124/67 129/79  Pulse: 75 97 76 91  Resp: '18  15 16  '$ Temp: 97.7 F (36.5 C) 97.8 F (36.6 C) 97.8 F (36.6 C) 98.2 F (36.8 C)  TempSrc:   Oral Oral  SpO2: 96% 95% 99% 97%  Weight:      Height:        Intake/Output Summary (Last 24 hours) at 11/16/2020 1819 Last data filed at 11/16/2020 1450 Gross per 24 hour  Intake 1596.45 ml  Output --  Net 1596.45 ml    03/14 1901 - 03/16 0700 In: 1340.7 [I.V.:1340.7] Out: 550 [Urine:550]  Filed Weights   11/13/20 1213  Weight: 72 kg    Physical Examination:    General-appears in no acute distress  Heart-S1-S2, regular, no  murmur auscultated  Lungs-clear to auscultation bilaterally, no wheezing or crackles auscultated  Abdomen-soft, nontender, no organomegaly  Extremities-no edema in the lower extremities  Neuro-alert, oriented x3, no focal deficit noted   Status is: Inpatient  Dispo: The patient is from: Home              Anticipated d/c is to: Home              Anticipated d/c date is: 11/17/2020              Patient currently not stable for discharge  Barrier to discharge-ongoing rectal bleeding, monitoring hemoglobin            Data Reviewed:   Recent Results (from the past 240 hour(s))  Resp Panel by RT-PCR (Flu A&B, Covid) Nasopharyngeal Swab     Status: None   Collection Time: 11/13/20  1:50 PM   Specimen: Nasopharyngeal Swab; Nasopharyngeal(NP) swabs in vial transport medium  Result Value Ref Range Status   SARS Coronavirus 2 by RT PCR NEGATIVE NEGATIVE Final    Comment: (NOTE) SARS-CoV-2 target nucleic acids are NOT DETECTED.  The SARS-CoV-2 RNA is generally detectable in upper respiratory specimens during the acute phase of infection. The lowest concentration of SARS-CoV-2 viral copies this assay can detect is 138 copies/mL. A negative result does not preclude SARS-Cov-2 infection and should not be used as the sole basis for treatment or other patient management decisions. A negative result may occur with  improper specimen collection/handling, submission of specimen other than nasopharyngeal swab, presence of viral mutation(s) within the areas targeted by this assay, and inadequate number of viral copies(<138 copies/mL). A negative result must be combined with clinical observations, patient history, and epidemiological information. The expected result is Negative.  Fact Sheet for Patients:  EntrepreneurPulse.com.au  Fact Sheet for Healthcare Providers:  IncredibleEmployment.be  This test is no t yet approved or cleared by the [place] FDA and  has been authorized for detection and/or diagnosis of SARS-CoV-2 by FDA under an Emergency Use Authorization (EUA). This EUA will remain  in effect (meaning this test can be used) for the duration of the COVID-19 declaration under Section 564(b)(1) of the Act, 21 U.S.C.section 360bbb-3(b)(1), unless the authorization is terminated  or revoked sooner.       Influenza A by PCR NEGATIVE NEGATIVE Final   Influenza B by PCR NEGATIVE NEGATIVE Final    Comment: (NOTE) The Xpert Xpress SARS-CoV-2/FLU/RSV plus assay is intended as an aid in the diagnosis of influenza from Nasopharyngeal swab  specimens and should not be used as a sole basis for treatment. Nasal washings and aspirates are unacceptable for Xpert Xpress SARS-CoV-2/FLU/RSV testing.  Fact Sheet for Patients: EntrepreneurPulse.com.au  Fact Sheet for Healthcare Providers: IncredibleEmployment.be  This test is not yet approved or cleared by the [place] FDA and has been authorized for detection and/or diagnosis of SARS-CoV-2 by FDA under an Emergency Use Authorization (EUA). This EUA will remain in effect (meaning this test can be used) for the duration of the COVID-19 declaration under Section 564(b)(1) of the Act, 21 U.S.C. section 360bbb-3(b)(1), unless the authorization is terminated or revoked.  Performed at [facility], [address] [place], [place] [address]     Recent Labs  Lab 11/13/20 1212  LIPASE 63*   No results for input(s): AMMONIA in the last 168 hours.  Cardiac Enzymes: No results for input(s): CKTOTAL, CKMB, CKMBINDEX, TROPONINI in the last 168 hours. BNP (last 3 results) No results for input(s): BNP in the last 8760 hours.  ProBNP (last 3 results) No results for input(s): PROBNP in the last 8760 hours.  Studies:  No results found.     Oswald Hillock   [facility] If 7PM-7AM, please contact night-coverage  at [email or web address], Office  [phone]   11/16/2020, 6:19 PM  LOS: 1 day

## 2020-11-17 ENCOUNTER — Encounter (HOSPITAL_COMMUNITY): Payer: Self-pay | Admitting: Gastroenterology

## 2020-11-17 LAB — CBC
HCT: 25.5 % — ABNORMAL LOW (ref 36.0–46.0)
Hemoglobin: 7.8 g/dL — ABNORMAL LOW (ref 12.0–15.0)
MCH: 30.6 pg (ref 26.0–34.0)
MCHC: 30.6 g/dL (ref 30.0–36.0)
MCV: 100 fL (ref 80.0–100.0)
Platelets: 172 K/uL (ref 150–400)
RBC: 2.55 MIL/uL — ABNORMAL LOW (ref 3.87–5.11)
RDW: 13.2 % (ref 11.5–15.5)
WBC: 8.3 K/uL (ref 4.0–10.5)
nRBC: 0 % (ref 0.0–0.2)

## 2020-11-17 LAB — BASIC METABOLIC PANEL WITH GFR
Anion gap: 9 (ref 5–15)
BUN: 49 mg/dL — ABNORMAL HIGH (ref 8–23)
CO2: 14 mmol/L — ABNORMAL LOW (ref 22–32)
Calcium: 7.9 mg/dL — ABNORMAL LOW (ref 8.9–10.3)
Chloride: 116 mmol/L — ABNORMAL HIGH (ref 98–111)
Creatinine, Ser: 2.56 mg/dL — ABNORMAL HIGH (ref 0.44–1.00)
GFR, Estimated: 17 mL/min — ABNORMAL LOW (ref >60)
Glucose, Bld: 113 mg/dL — ABNORMAL HIGH (ref 70–99)
Potassium: 4.6 mmol/L (ref 3.5–5.1)
Sodium: 139 mmol/L (ref 135–145)

## 2020-11-17 LAB — SURGICAL PATHOLOGY

## 2020-11-17 NOTE — Discharge Summary (Signed)
Physician Discharge Summary  Brandy Fuentes [id number] DOB: [birth year] DOA: 11/13/2020  PCP: Curly Rim, MD  Admit date: 11/13/2020 Discharge date: 11/17/2020  Time spent: 60 minutes  Recommendations for Outpatient Follow-up:  1. Follow-up gastroenterology in 2 weeks  Discharge Diagnoses:  Principal Problem:   Acute blood loss anemia Active Problems:   Diverticulitis of sigmoid colon   Barrett's esophagus   Hypertension   CKD (chronic kidney disease) stage 5, GFR less than 15 ml/min (HCC)   Hyperlipidemia   Lower GI bleed   Obesity (BMI 30-39.9)   Acute GI bleeding   Discharge Condition: Stable  Diet recommendation: Heart healthy diet  Filed Weights   11/13/20 1213  Weight: 72 kg    History of present illness:  87 year old female with CKD stage V not on dialysis, history of diverticulosis, diverticulitis, IBD, Barrett's esophagus, chronic gastritis, hiatal hernia, hypertension presented to ED with 1 day history of bright and dark red painless rectal bleeding.  In the ED FOBT was positive, creatinine 2.9, hemoglobin 11.5. Patient was seen by GI, underwent colonoscopy, which showed diverticulosis in the sigmoid colon.  Hospital Course:  1. Lower GI bleed-secondary to diverticulosis seen on colonoscopy.  No more bleeding overnight as per patient. Discussed with Dr. Benson Norway, today hemoglobin is 7.8, yesterday it was 8.1.  Okay to discharge from GI standpoint and follow-up with gastroenterology in 2 weeks.  She had 2. Acute blood loss anemia-hemoglobin dropped to 8.1 yesterday so discharge was held.  Today hemoglobin is stable at 7.8.  No more bleeding as above.  Will discharge her home.  3. Barrett's esophagus-history of chronic gastritis, hiatal hernia.  Continue PPI. 4. Hypertension-blood pressure is stable.  Will restart olmesartan at discharge. 5. Acute kidney injury on CKD stage V-baseline creatinine 2.3-2.7, she presented with creatinine of 2.9 on admission.   Creatinine is 2. 5 6 today, likely at baseline 6. Hyperkalemia-resolved 7. Obesity-BMI 35.59 kg/m   Procedures:  Colonoscopy  Consultations:  Gastroenterology  Discharge Exam: Vitals:   11/16/20 2112 11/17/20 0416  BP: (!) 150/69 131/61  Pulse: 79 67  Resp: 18 18  Temp: 98.1 F (36.7 C) 97.9 F (36.6 C)  SpO2: 100% 98%    General: Appears in no acute distress Cardiovascular: S1-S2, regular Respiratory: Clear to auscultation bilaterally  Discharge Instructions   Discharge Instructions    Diet - low sodium heart healthy   Complete by: As directed    Increase activity slowly   Complete by: As directed      Allergies as of 11/17/2020      Reactions   Ciprofloxacin    Made her feel crazy, per pt      Medication List    STOP taking these medications   diclofenac sodium 1 % Gel Commonly known as: VOLTAREN     TAKE these medications   COD LIVER PO Take 1 tablet by mouth daily.   ferrous sulfate 325 (65 FE) MG tablet Take 325 mg by mouth daily.   mesalamine 1.2 g EC tablet Commonly known as: LIALDA Take 2.4 g by mouth daily.   olmesartan 20 MG tablet Commonly known as: BENICAR Take 20 mg by mouth daily.   omeprazole 20 MG capsule Commonly known as: PRILOSEC Take 20 mg by mouth daily.   Vitamin D-3 125 MCG (5000 UT) Tabs Take 1 tablet by mouth daily.      Allergies  Allergen Reactions  . Ciprofloxacin     Made her feel crazy, per pt  The results of significant diagnostics from this hospitalization (including imaging, microbiology, ancillary and laboratory) are listed below for reference.    Significant Diagnostic Studies: NM GI Blood Loss  Result Date: 11/14/2020 CLINICAL DATA:  Decreased hemoglobin with lower gastrointestinal bleeding EXAM: NUCLEAR MEDICINE GASTROINTESTINAL BLEEDING SCAN TECHNIQUE: Sequential abdominal images were obtained following intravenous administration of Tc-14mlabeled red blood cells. RADIOPHARMACEUTICALS:   24.1 mCi Tc-938mertechnetate in-vitro labeled red cells. COMPARISON:  None. FINDINGS: Images of the anterior abdomen and pelvis were obtained serially over a 2 hour time span. There is no abnormal uptake of radiotracer to indicate a site of active gastrointestinal bleeding. IMPRESSION: No site of active gastrointestinal bleeding evident on this study. No abnormality appreciable. Electronically Signed   By: WiLowella GripII M.D.   On: 11/14/2020 13:31    Microbiology: Recent Results (from the past 240 hour(s))  Resp Panel by RT-PCR (Flu A&B, Covid) Nasopharyngeal Swab     Status: None   Collection Time: 11/13/20  1:50 PM   Specimen: Nasopharyngeal Swab; Nasopharyngeal(NP) swabs in vial transport medium  Result Value Ref Range Status   SARS Coronavirus 2 by RT PCR NEGATIVE NEGATIVE Final    Comment: (NOTE) SARS-CoV-2 target nucleic acids are NOT DETECTED.  The SARS-CoV-2 RNA is generally detectable in upper respiratory specimens during the acute phase of infection. The lowest concentration of SARS-CoV-2 viral copies this assay can detect is 138 copies/mL. A negative result does not preclude SARS-Cov-2 infection and should not be used as the sole basis for treatment or other patient management decisions. A negative result may occur with  improper specimen collection/handling, submission of specimen other than nasopharyngeal swab, presence of viral mutation(s) within the areas targeted by this assay, and inadequate number of viral copies(<138 copies/mL). A negative result must be combined with clinical observations, patient history, and epidemiological information. The expected result is Negative.  Fact Sheet for Patients:  htEntrepreneurPulse.com.auFact Sheet for Healthcare Providers:  htIncredibleEmployment.beThis test is no t yet approved or cleared by the [place] and  has been authorized for detection and/or diagnosis of SARS-CoV-2  by FDA under an Emergency Use Authorization (EUA). This EUA will remain  in effect (meaning this test can be used) for the duration of the COVID-19 declaration under Section 564(b)(1) of the Act, 21 U.S.C.section 360bbb-3(b)(1), unless the authorization is terminated  or revoked sooner.       Influenza A by PCR NEGATIVE NEGATIVE Final   Influenza B by PCR NEGATIVE NEGATIVE Final    Comment: (NOTE) The Xpert Xpress SARS-CoV-2/FLU/RSV plus assay is intended as an aid in the diagnosis of influenza from Nasopharyngeal swab specimens and should not be used as a sole basis for treatment. Nasal washings and aspirates are unacceptable for Xpert Xpress SARS-CoV-2/FLU/RSV testing.  Fact Sheet for Patients: htEntrepreneurPulse.com.auFact Sheet for Healthcare Providers: htIncredibleEmployment.beThis test is not yet approved or cleared by the [place] and has been authorized for detection and/or diagnosis of SARS-CoV-2 by FDA under an Emergency Use Authorization (EUA). This EUA will remain in effect (meaning this test can be used) for the duration of the COVID-19 declaration under Section 564(b)(1) of the Act, 21 U.S.C. section 360bbb-3(b)(1), unless the authorization is terminated or revoked.  [facility]formed [facility] [place] [id number]    Labs: Basic Metabolic Panel: Recent Labs  Lab 11/13/20 1212 11/14/20 [id number]:[id number]:[id number]/15/22 0945 11/16/20 0356 11/17/20 0356  NA 139 139 139  --  139 139  K 4.5 4.7 5.5* 4.5 4.6 4.6  CL 112* 117* 115*  --  116* 116*  CO2 17* 15* 18*  --  17* 14*  GLUCOSE 98 94 88  --  119* 113*  BUN 73* 62* 56*  --  51* 49*  CREATININE 2.99* 2.56* 2.74*  --  2.54* 2.56*  CALCIUM 8.1* 7.7* 8.1*  --  8.0* 7.9*   Liver Function Tests: Recent Labs  Lab 11/13/20 1212  AST 11*  ALT 13  ALKPHOS 50  BILITOT 0.6  PROT 6.5  ALBUMIN 3.4*   Recent Labs  Lab  11/13/20 1212  LIPASE 63*   No results for input(s): AMMONIA in the last 168 hours. CBC: Recent Labs  Lab 11/13/20 1212 11/13/20 1600 11/14/20 0749 11/14/20 1549 11/15/20 0355 11/16/20 0356 11/17/20 0356  WBC 10.4   < > 8.6 9.0 12.5* 8.4 8.3  NEUTROABS 8.2*  --   --   --   --   --   --   HGB 11.5*   < > 9.2* 9.4* 9.2* 8.1* 7.8*  HCT 35.9*   < > 29.6* 30.6* 29.3* 25.8* 25.5*  MCV 99.2   < > 99.7 98.7 97.0 98.9 100.0  PLT 252   < > 202 225 PLATELET CLUMPS NOTED ON SMEAR, COUNT APPEARS DECREASED 165 172   < > = values in this interval not displayed.       Signed:  Oswald Hillock MD.  [facility] 11/17/2020, 10:42 AM

## 2022-10-08 IMAGING — NM NM GI BLOOD LOSS
2 series · 12 of 12 positions shown · non-contrast
Comparison: None.

CLINICAL DATA: Decreased hemoglobin with lower gastrointestinal
bleeding

EXAM:
NUCLEAR MEDICINE GASTROINTESTINAL BLEEDING SCAN
TECHNIQUE: Sequential abdominal images were obtained following intravenous
administration of Qc-IIm labeled red blood cells.
RADIOPHARMACEUTICALS:  24.1 mCi Qc-IIm pertechnetate in-vitro
labeled red cells.

[Series 1: gi bleed 2hr · 3.28mm/px · 6 of 60 frames shown]
[frame 6/60]
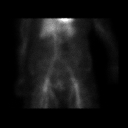
[frame 16/60]
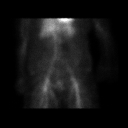
[frame 26/60]
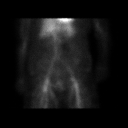
[frame 36/60]
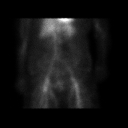
[frame 46/60]
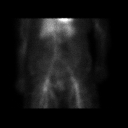
[frame 56/60]
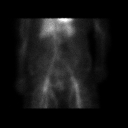

[Series 1: gi bleed hr 1 · 3.28mm/px · 6 of 60 frames shown]
[frame 6/60]
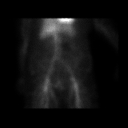
[frame 16/60]
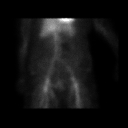
[frame 26/60]
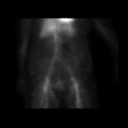
[frame 36/60]
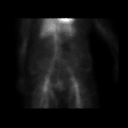
[frame 46/60]
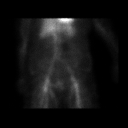
[frame 56/60]
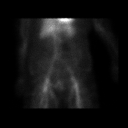

[12 of 12 positions shown; findings below may reference images not displayed]

FINDINGS: Images of the anterior abdomen and pelvis were obtained serially
over a 2 hour time span. There is no abnormal uptake of radiotracer
to indicate a site of active gastrointestinal bleeding.
IMPRESSION: No site of active gastrointestinal bleeding evident on this study.
No abnormality appreciable.

## 2023-11-08 NOTE — Progress Notes (Signed)
 Office Follow up Note  11/08/2023  Interval History:  Brandy Fuentes is  89 y.o. yrs old female who returns in follow-up today. This is pleasant 89 y.o. yrs old female with history of longstanding hypertension, hypertensive kidney disease with CKD stage V, untreated hyperlipidemia who comes here for cardiovascular follow-up visit. Today, she was accompanied by her son.   Today's visit, she feels okay.  She gets shortness of breath upon walking more than a block.  She believes that is normal for her.  No chest pain or discomfort.  No orthopnea or PND.  She does have mild lower extremity edema at baseline.  She does have CKD stage V, follows with [facility].  She is currently not on diuretics.  She barely walks outside of the house.  She uses walker at home.  She lives with her son.  Currently, patient is on 5 mg of amlodipine for hypertension along with 20 mg of Benicar, blood pressure today was recorded as 134/74 mmHg.  She refuses statins in the past.   No prior known coronary disease.  There is no prior history of syncope or presyncope.   She had echocardiogram done on 11/19/2016  at [facility] which showed preserved ejection fraction, EF of 55 to 60%, mild mitral regurgitation, left atrium was mildly dilated. EKG today shows sinus rhythm, right bundle branch block, left axis deviation suggestive of left anterior fascicular block, this finding is new compared to EKG from 2015.   Echocardiogram 09/14/2022 .  Left Ventricle: Left ventricle size is normal. .  Left Ventricle: Systolic function is normal. EF: 55-60%. .  Left Ventricle: Doppler parameters consistent with mild diastolic dysfunction and low to normal LA pressure. .  Right Ventricle: Systolic function is normal. .  Right Ventricle: Right ventricle size is normal. .  Mitral Valve: There is mild regurgitation with a centrally directed jet.  Diastolic mitral  gradients of 89/5 mm Hg. .  Tricuspid Valve: The right ventricular systolic pressure is normal (<36 mmHg).   EKG today shows sinus rhythm, right bundle branch block, left anterior fascicular block.  Medications:   Current Outpatient Medications:  .  amLODIPine besylate (NORVASC) 5 mg tablet, Take one tablet (5 mg dose) by mouth daily., Disp: 90 tablet, Rfl: 3 .  Cod Liver Oil 1250-130 units CAPS, Take by mouth., Disp: , Rfl:  .  ferrous sulfate 325 (65 FE) MG tablet, Take one tablet (325 mg dose) by mouth with breakfast., Disp: , Rfl:  .  olmesartan (BENICAR) 20 MG tablet, Take one tablet (20 mg dose) by mouth daily., Disp: 90 tablet, Rfl: 3 .  omeprazole (PRILOSEC) 20 mg capsule, Take one capsule (20 mg dose) by mouth daily. Take 1 capsule by mouth once daily, Disp: 90 capsule, Rfl: 3 .  sodium bicarbonate 650 mg tablet, Take one tablet (650 mg dose) by mouth 2 (two) times daily., Disp: 180 tablet, Rfl: 0 .  vitamin D3 (CHOLECALCIFEROL) 5000 units TABS, Take one tablet (5,000 Units dose) by mouth daily., Disp: , Rfl:   REVIEW OF SYSTEMS: The patient denies nausea, vomiting, diarrhea, fever, chills, or bleeding. All other systems are reviewed and are negative except for that mentioned in the history of present illness.  LABS:   Lab Results  Component Value Date   Cholesterol, Total 258 (H) 01/30/2023   Cholesterol, Total 277 (H) 12/20/2021   Cholesterol, Total 270 (H) 08/25/2020   Lab Results  Component Value Date   HDL 49 01/30/2023  HDL 49 12/20/2021   HDL 55 08/25/2020   Lab Results  Component Value Date   LDL 170 (H) 01/30/2023   LDL 188 (H) 12/20/2021   LDL 182 (H) 08/25/2020   Lab Results  Component Value Date   Triglycerides 209 (H) 01/30/2023   Triglycerides 209 (H) 12/20/2021   Triglycerides 180 (H) 08/25/2020   No results found for: [facility]  Lab Results  Component Value Date   WBC 7.3 01/30/2023   Hemoglobin 10.3 (L) 01/30/2023    Hematocrit 31.7 (L) 01/30/2023   MCV 92 01/30/2023   Platelet Count 266 01/30/2023    Lab Results  Component Value Date   Creatinine 3.15 (H) 09/02/2023   BUN 38 (H) 09/02/2023   Sodium 141 09/02/2023   Potassium 4.4 09/02/2023   Chloride 107 (H) 09/02/2023   CO2 17 (L) 09/02/2023   Lab Results  Component Value Date   TSH 3.790 03/10/2019     PHYSICAL EXAM:  Vital Signs:  BP 134/74 (BP Location: Left Upper Arm, Patient Position: Sitting)   Pulse 83   Ht 4' 10 (1.473 m)   Wt 150 lb (68 kg)   LMP  (LMP Unknown)   Breastfeeding No   BMI 31.35 kg/m  Constitutional: Well-nourished well-developed ,  no acute distress.  HENT:normocephalic,atraumatic,oropharynx moist,no oral exudates.   Neck: Supple. No JVD. Carotid bruits: Absent bilaterally. Cardiovascular: RRR.  Soft systolic murmur at apex, no gallop. No rub. Respiratory: Mildly decreased breath sounds bilaterally.  No wheezes or crackles noted GI: Soft, nontender, nondistended, with normal bowel sounds, no masses or hepatosplenomegaly.  Skin: Warm, dry, no erythema, no rash. Musculoskeletal: Trace lower extremity edema , no tenderness, no cyanosis, no clubbing. Pulses: 1+ and intact bilaterally   Impression and plan:     1. Chronic diastolic congestive heart failure  (*)  Echocardiogram Complete WO Enhancing Agent    2. Hyperlipidemia, unspecified hyperlipidemia type  ECG 12 lead    3. Hypertension, unspecified type  ECG 12 lead    4. Mitral valve disease  Echocardiogram Complete WO Enhancing Agent    5. Refusal of statin medication by patient      6. CKD (chronic kidney disease) stage 5, GFR less than 15 ml/min (*)      7. Anemia due to stage 5 chronic kidney disease, not on chronic dialysis  (*)                    She seems to be doing fairly well.  She does have dyspnea with mild exertion and trace lower extremity edema, unchanged from her baseline.  This is likely secondary to diastolic dysfunction and CKD.   Cardiogram from last year showed preserved ejection fraction, grade 1 diastolic dysfunction, mild mitral regurgitation and mitral stenosis.  She does have CKD stage V at baseline.  She was on diuretics in the past.  Currently, she is doing okay with no diuretics.   She refused option of statins.  In the past, we discussed about cardiac workup including stress test which she was not interested in. EKG today shows sinus rhythm, right bundle branch block, left anterior fascicular block, unchanged from baseline. Plan for echocardiogram for surveillance of valvular heart disease.  We had discussion about importance of life style modification, keeping BP, blood sugar and cholesterol, body weight at goal, role of regular exercise (at least 30 minutes of moderate intensity aerobic exercise 5 days in a week), following low fat, low carb, low salt heart healthy  diet, abstinence from smoking and drinking etc.  We also discussed about visiting PCP visit for non cardiac issues, importance of regular follow up, health screening  and medication compliance. Follow-up in 6 months, sooner if needed.   Marcello MARLA Lennox, MD    Note: This documented was generated using voice recognition software. There may be unintended transcription errors that were not detected upon document review.

## 2024-01-14 NOTE — Progress Notes (Signed)
 This patient's chart has been reviewed by a [facility] Specialist.   Attempted to contact patient in order to discuss appointments and screenings due for the upcoming year.   UTR (No voicemail / No phone number / Invalid phone number). with recommendations and contact information.  Additional Comments: Person disconnected the call

## 2024-03-13 ENCOUNTER — Encounter: Payer: Self-pay | Admitting: Emergency Medicine

## 2024-03-13 ENCOUNTER — Ambulatory Visit: Admission: EM | Admit: 2024-03-13 | Discharge: 2024-03-13 | Disposition: A | Discharge status: Home / Self Care

## 2024-03-13 ENCOUNTER — Ambulatory Visit (HOSPITAL_BASED_OUTPATIENT_CLINIC_OR_DEPARTMENT_OTHER)
Admission: RE | Admit: 2024-03-13 | Discharge: 2024-03-13 | Disposition: A | Discharge status: Home / Self Care | Source: Ambulatory Visit | Attending: Physician Assistant | Admitting: Physician Assistant

## 2024-03-13 DIAGNOSIS — J209 Acute bronchitis, unspecified: Secondary | ICD-10-CM | POA: Diagnosis not present

## 2024-03-13 DIAGNOSIS — J440 Chronic obstructive pulmonary disease with acute lower respiratory infection: Secondary | ICD-10-CM

## 2024-03-13 DIAGNOSIS — R051 Acute cough: Secondary | ICD-10-CM

## 2024-03-13 MED ORDER — AEROCHAMBER PLUS FLO-VU MEDIUM MISC
1.0000 | Freq: Once | Status: AC
Start: 2024-03-13 — End: 2024-03-13
  Administered 2024-03-13: 1

## 2024-03-13 MED ORDER — BENZONATATE 100 MG PO CAPS: 100.0000 mg | ORAL_CAPSULE | Freq: Three times a day (TID) | ORAL | 0 refills | Status: AC

## 2024-03-13 MED ORDER — DOXYCYCLINE HYCLATE 100 MG PO CAPS: 100.0000 mg | ORAL_CAPSULE | Freq: Two times a day (BID) | ORAL | 0 refills | Status: AC

## 2024-03-13 MED ORDER — ALBUTEROL SULFATE HFA 108 (90 BASE) MCG/ACT IN AERS
2.0000 | INHALATION_SPRAY | Freq: Once | RESPIRATORY_TRACT | Status: AC
  Administered 2024-03-13: 2 via RESPIRATORY_TRACT

## 2024-03-13 NOTE — Discharge Instructions (Signed)
 Start doxycycline  100 mg twice daily for 10 days.  Stay out of the sun while on this medication.  Use Tessalon  to help with the cough.  I recommend a humidifier and nasal saline/sinus rinses.  Please get the x-ray as we discussed.  I will contact you if this changes our treatment plan.  Follow-up with our clinic or primary care first thing next week if symptoms have not gone away.  If anything worsens and she has high fever, worsening cough, shortness of breath, chest pain, weakness, sleeping all the time, nausea/vomiting interfere with oral intake she needs to go to the emergency room immediately.

## 2024-03-13 NOTE — ED Triage Notes (Signed)
 Pt presents c/o cough x 2 days. Pt says it just started randomly and denies any additional sxs.

## 2024-03-13 NOTE — ED Provider Notes (Signed)
 [name]    CSN: [id number] Arrival date & time: 03/13/24  1140      History   Chief Complaint Chief Complaint  Patient presents with   Cough    HPI Brandy Fuentes is a 89 y.o. female.   Patient presents today accompanied by a family who provide the majority of history as patient is hard of hearing.  Reports a 3-day history of cough, sore throat, wheezing.  Denies any shortness of breath, chest pain, fever, nausea, vomiting.  Denies any known sick contacts.  They did take an at home COVID test that was negative.  She is up-to-date on all of her vaccines including COVID and RSV.  She has been taking Coricidin without improvement of symptoms.  Denies any recent antibiotics or steroids.  She does have a history of recurrent bronchitis but denies formal diagnosis of COPD.  She has not used an inhaler in the past.  She does not smoke.  She denies history of heart failure and denies any increased lower extremity edema or sudden weight gain.    Past Medical History:  Diagnosis Date   Barrett's esophagus    Chronic gastritis    Chronic kidney disease    Constipation    Diverticulitis of sigmoid colon    Hiatal hernia    Hypertension    Reflux     Patient Active Problem List   Diagnosis Date Noted   Acute GI bleeding 11/15/2020   Acute blood loss anemia 11/14/2020   Lower GI bleed 11/14/2020   Obesity (BMI 30-39.9) 11/14/2020   GI bleed 11/13/2020   Heel spur, left 06/22/2018   Colon polyp 06/20/2018   Diverticulitis of large intestine without perforation or abscess without bleeding 03/14/2015   Refusal of statin medication by patient 03/14/2015   Depression 03/09/2014   Hyperlipidemia 09/08/2013   Anemia 12/12/2012   CKD (chronic kidney disease) stage 5, GFR less than 15 ml/min (HCC) 12/12/2012   Lower extremity edema 12/12/2012   Body aches 11/30/2012   Edema 10/06/2012   Dyspnea 09/08/2012   Diverticulitis of sigmoid colon    Barrett's esophagus     Hypertension    Fecal incontinence 06/25/2012    Past Surgical History:  Procedure Laterality Date   ABDOMINAL HYSTERECTOMY     BIOPSY  11/15/2020   Procedure: BIOPSY;  Surgeon: Rollin Dover, MD;  Location: [facility];  Service: Endoscopy;;   BLADDER SUSPENSION     COLONOSCOPY  11/22/00,07/11/05   Dr. Zachary Porch   COLONOSCOPY N/A 11/15/2020   Procedure: COLONOSCOPY;  Surgeon: Rollin Dover, MD;  Location: [facility];  Service: Endoscopy;  Laterality: N/A;   ESOPHAGOGASTRODUODENOSCOPY  01/16/00,07/04/05   Dr. Zachary Porch, with Dilation   HERNIA REPAIR     RECTOCELE REPAIR     vault prolapse repair      OB History   No obstetric history on file.      Home Medications    Prior to Admission medications   Medication Sig Start Date End Date Taking? Authorizing Provider  balsalazide (COLAZAL) 750 MG capsule 3 cap(s) orally 3 times a day for 30 day(s) 11/30/20  Yes [provider]  benzonatate  (TESSALON ) 100 MG capsule Take 1 capsule (100 mg total) by mouth every 8 (eight) hours. 03/13/24  Yes Kennie Karapetian K, PA-C  doxycycline  (VIBRAMYCIN ) 100 MG capsule Take 1 capsule (100 mg total) by mouth 2 (two) times daily. 03/13/24  Yes Lisel Siegrist K, PA-C  amLODipine (NORVASC) 5 MG tablet Take  5 mg by mouth daily.    [provider]  Cholecalciferol (VITAMIN D-3) 5000 units TABS Take 1 tablet by mouth daily.    [provider]  Cod Liver Oil (COD LIVER PO) Take 1 tablet by mouth daily.    [provider]  ferrous sulfate 325 (65 FE) MG tablet Take 325 mg by mouth daily.    [provider]  mesalamine (LIALDA) 1.2 g EC tablet Take 2.4 g by mouth daily. 05/25/18   [provider]  olmesartan (BENICAR) 20 MG tablet Take 20 mg by mouth daily. 02/03/20   [provider]  omeprazole (PRILOSEC) 20 MG capsule Take 20 mg by mouth daily.    [provider]  sodium bicarbonate 650 MG tablet Take 650 mg by mouth 2 (two) times daily.     [provider]    Family History Family History  Problem Relation Age of Onset   Heart disease Mother    Heart disease Father    Colon cancer Brother    Colon polyps Brother    Heart disease Brother    Heart disease Maternal Uncle    Breast cancer Paternal Aunt    Esophageal cancer Paternal Aunt    Heart disease Paternal Uncle     Social History Social History   Tobacco Use   Smoking status: Never    Passive exposure: Current   Smokeless tobacco: Never  Vaping Use   Vaping status: Never Used  Substance Use Topics   Alcohol use: No   Drug use: No     Allergies   Ciprofloxacin   Review of Systems Review of Systems  Constitutional:  Positive for activity change. Negative for appetite change, fatigue and fever.  HENT:  Positive for congestion and sore throat. Negative for sinus pressure and sneezing.   Respiratory:  Positive for cough and wheezing. Negative for chest tightness and shortness of breath.   Cardiovascular:  Negative for chest pain.  Gastrointestinal:  Negative for abdominal pain, diarrhea, nausea and vomiting.     Physical Exam Triage Vital Signs ED Triage Vitals  Encounter Vitals Group     BP 03/13/24 1307 (!) 143/82     Girls Systolic BP Percentile --      Girls Diastolic BP Percentile --      Boys Systolic BP Percentile --      Boys Diastolic BP Percentile --      Pulse Rate 03/13/24 1307 84     Resp 03/13/24 1307 18     Temp 03/13/24 1307 98.1 F (36.7 C)     Temp Source 03/13/24 1307 Oral     SpO2 03/13/24 1307 97 %     Weight 03/13/24 1306 154 lb 5.2 oz (70 kg)     Height --      Head Circumference --      Peak Flow --      Pain Score 03/13/24 1304 0     Pain Loc --      Pain Education --      Exclude from Growth Chart --    No data found.  Updated Vital Signs BP (!) 143/82 (BP Location: Left Arm)   Pulse 84   Temp 98.1 F (36.7 C) (Oral)   Resp 18   Wt 154 lb 5.2 oz (70 kg)   SpO2 97%   BMI 34.60 kg/m    Visual Acuity Right Eye Distance:   Left Eye Distance:   Bilateral Distance:  Right Eye Near:   Left Eye Near:    Bilateral Near:     Physical Exam Vitals reviewed.  Constitutional:      General: She is awake. She is not in acute distress.    Appearance: Normal appearance. She is well-developed. She is not ill-appearing.     Comments: Very pleasant female appears stated age in no acute distress sitting comfortably in exam room  HENT:     Head: Normocephalic and atraumatic.     Right Ear: Tympanic membrane, ear canal and external ear normal. Tympanic membrane is not erythematous or bulging.     Left Ear: Tympanic membrane, ear canal and external ear normal. Tympanic membrane is not erythematous or bulging.     Nose:     Right Sinus: No maxillary sinus tenderness or frontal sinus tenderness.     Left Sinus: No maxillary sinus tenderness or frontal sinus tenderness.     Mouth/Throat:     Dentition: Has dentures.     Pharynx: Uvula midline. Postnasal drip present. No oropharyngeal exudate or posterior oropharyngeal erythema.  Cardiovascular:     Rate and Rhythm: Normal rate and regular rhythm.     Heart sounds: S1 normal and S2 normal.  Pulmonary:     Effort: Pulmonary effort is normal.     Breath sounds: Wheezing present. No rhonchi or rales.     Comments: Scattered wheezing; improved following albuterol  in clinic Psychiatric:        Behavior: Behavior is cooperative.      UC Treatments / Results  Labs (all labs ordered are listed, but only abnormal results are displayed) Labs Reviewed  CBC WITH DIFFERENTIAL/PLATELET  BASIC METABOLIC PANEL WITH GFR    EKG   Radiology No results found.  Procedures Procedures (including critical care time)  Medications Ordered in UC Medications  albuterol  (VENTOLIN  HFA) 108 (90 Base) MCG/ACT inhaler 2 puff (2 puffs Inhalation Given 03/13/24 1347)  AeroChamber Plus Flo-Vu Medium MISC 1 each (1 each Other Given 03/13/24 1348)     Initial Impression / Assessment and Plan / UC Course  I have reviewed the triage vital signs and the nursing notes.  Pertinent labs & imaging results that were available during my care of the patient were reviewed by me and considered in my medical decision making (see chart for details).     Patient is well-appearing, afebrile, nontoxic, nontachycardic.  We discussed potential utility of viral testing, however, daughter reports that they have already done an at home COVID test and they are confident that it is not a virus and more concerned about recurrent bronchitis.  They declined viral testing including send out COVID, flu, RSV.  Patient did have wheezing on exam that resolved with 2 puffs of albuterol .  She was sent home with this medication to be used every 6 hours as needed we discussed that if she is having to use it more frequently or if it is not effective she needs to be seen emergently.  She was started on doxycycline  as this does not require renal dose adjustment.  We discussed that she is to avoid prolonged sun exposure while on this medication due to associated photosensitivity.  Chest x-ray was ordered outpatient as we do not have imaging onsite.  Daughter will take her directly to [facility] to have this imaging done and then I will contact them if there is any evidence of pneumonia.  Based on curb 65 score if she does have pneumonia I would  recommend ER evaluation.  CBC and BMP were obtained and we discussed that she has significant leukocytosis or abnormal kidney function increased from baseline she would need to go to the ER.  Recommend she follow-up with her primary care first thing next week.  She was given Tessalon  for cough and encouraged to use a humidifier as well as gargling warm salt water to manage her symptoms.  Discussed that if anything worsens or changes she needs to be seen emergently including fever, worsening cough, shortness of breath, chest pain,  lethargy, decreased appetite, change in behavior.  Strict return precautions given.  All questions were answered to patient and daughter satisfaction.  They expressed understanding and agreement with treatment plan.  Final Clinical Impressions(s) / UC Diagnoses   Final diagnoses:  Acute cough  Bronchitis, chronic obstructive w acute bronchitis (HCC)     Discharge Instructions      Start doxycycline  100 mg twice daily for 10 days.  Stay out of the sun while on this medication.  Use Tessalon  to help with the cough.  I recommend a humidifier and nasal saline/sinus rinses.  Please get the x-ray as we discussed.  I will contact you if this changes our treatment plan.  Follow-up with our clinic or primary care first thing next week if symptoms have not gone away.  If anything worsens and she has high fever, worsening cough, shortness of breath, chest pain, weakness, sleeping all the time, nausea/vomiting interfere with oral intake she needs to go to the emergency room immediately.     ED Prescriptions     Medication Sig Dispense Auth. Provider   doxycycline  (VIBRAMYCIN ) 100 MG capsule Take 1 capsule (100 mg total) by mouth 2 (two) times daily. 20 capsule Adalay Azucena K, PA-C   benzonatate  (TESSALON ) 100 MG capsule Take 1 capsule (100 mg total) by mouth every 8 (eight) hours. 21 capsule Bertel Venard K, PA-C      PDMP not reviewed this encounter.   Sherrell Rocky POUR, PA-C 03/13/24 1429

## 2024-03-14 ENCOUNTER — Ambulatory Visit (HOSPITAL_COMMUNITY): Payer: Self-pay | Admitting: Physician Assistant

## 2024-03-14 LAB — CBC WITH DIFFERENTIAL/PLATELET
Basophils Absolute: 0.1 x10E3/uL (ref 0.0–0.2)
Basos: 1 %
EOS (ABSOLUTE): 0 x10E3/uL (ref 0.0–0.4)
Eos: 1 %
Hematocrit: 32.8 % — ABNORMAL LOW (ref 34.0–46.6)
Hemoglobin: 10.4 g/dL — ABNORMAL LOW (ref 11.1–15.9)
Immature Grans (Abs): 0 x10E3/uL (ref 0.0–0.1)
Immature Granulocytes: 0 %
Lymphocytes Absolute: 1 x10E3/uL (ref 0.7–3.1)
Lymphs: 18 %
MCH: 30.5 pg (ref 26.6–33.0)
MCHC: 31.7 g/dL (ref 31.5–35.7)
MCV: 96 fL (ref 79–97)
Monocytes Absolute: 0.3 x10E3/uL (ref 0.1–0.9)
Monocytes: 6 %
Neutrophils Absolute: 4.2 x10E3/uL (ref 1.4–7.0)
Neutrophils: 73 %
Platelets: 197 x10E3/uL (ref 150–450)
RBC: 3.41 x10E6/uL — ABNORMAL LOW (ref 3.77–5.28)
RDW: 12.6 % (ref 11.7–15.4)
WBC: 5.6 x10E3/uL (ref 3.4–10.8)

## 2024-03-14 LAB — BASIC METABOLIC PANEL WITH GFR
BUN/Creatinine Ratio: 11 — ABNORMAL LOW (ref 12–28)
BUN: 41 mg/dL — ABNORMAL HIGH (ref 10–36)
CO2: 15 mmol/L — ABNORMAL LOW (ref 20–29)
Calcium: 8.7 mg/dL (ref 8.7–10.3)
Chloride: 105 mmol/L (ref 96–106)
Creatinine, Ser: 3.68 mg/dL — ABNORMAL HIGH (ref 0.57–1.00)
Glucose: 85 mg/dL (ref 70–99)
Potassium: 4.3 mmol/L (ref 3.5–5.2)
Sodium: 138 mmol/L (ref 134–144)
eGFR: 11 mL/min/1.73 — ABNORMAL LOW (ref >59)
